# Patient Record
Sex: Female | Born: 1975 | Race: Black or African American | Hispanic: No | Marital: Married | Smoking: Never smoker
Health system: Southern US, Community
[De-identification: ages and names within clinical notes are randomized; demographics above are authoritative.]

## PROBLEM LIST (undated history)

## (undated) DIAGNOSIS — Z8619 Personal history of other infectious and parasitic diseases: Secondary | ICD-10-CM

## (undated) HISTORY — PX: TOE SURGERY: SHX1073

## (undated) HISTORY — DX: Personal history of other infectious and parasitic diseases: Z86.19

## (undated) HISTORY — PX: WISDOM TOOTH EXTRACTION: SHX21

---

## 2007-01-17 ENCOUNTER — Inpatient Hospital Stay (HOSPITAL_COMMUNITY): Admission: AD | Admit: 2007-01-17 | Discharge: 2007-01-17 | Payer: Self-pay | Admitting: Obstetrics & Gynecology

## 2007-01-23 ENCOUNTER — Inpatient Hospital Stay (HOSPITAL_COMMUNITY): Admission: AD | Admit: 2007-01-23 | Discharge: 2007-01-23 | Payer: Self-pay | Admitting: Family Medicine

## 2007-04-15 ENCOUNTER — Inpatient Hospital Stay (HOSPITAL_COMMUNITY): Admission: AD | Admit: 2007-04-15 | Discharge: 2007-04-15 | Payer: Self-pay | Admitting: Obstetrics and Gynecology

## 2007-04-29 ENCOUNTER — Inpatient Hospital Stay (HOSPITAL_COMMUNITY): Admission: AD | Admit: 2007-04-29 | Discharge: 2007-04-29 | Payer: Self-pay | Admitting: Family Medicine

## 2007-05-17 ENCOUNTER — Ambulatory Visit (HOSPITAL_COMMUNITY): Admission: RE | Admit: 2007-05-17 | Discharge: 2007-05-17 | Payer: Self-pay | Admitting: Family Medicine

## 2007-05-30 ENCOUNTER — Inpatient Hospital Stay (HOSPITAL_COMMUNITY): Admission: AD | Admit: 2007-05-30 | Discharge: 2007-05-30 | Payer: Self-pay | Admitting: Family Medicine

## 2007-06-14 ENCOUNTER — Ambulatory Visit (HOSPITAL_COMMUNITY): Admission: RE | Admit: 2007-06-14 | Discharge: 2007-06-14 | Payer: Self-pay | Admitting: Family Medicine

## 2007-10-01 ENCOUNTER — Inpatient Hospital Stay (HOSPITAL_COMMUNITY): Admission: AD | Admit: 2007-10-01 | Discharge: 2007-10-01 | Payer: Self-pay | Admitting: Obstetrics and Gynecology

## 2007-10-20 ENCOUNTER — Inpatient Hospital Stay (HOSPITAL_COMMUNITY): Admission: AD | Admit: 2007-10-20 | Discharge: 2007-10-20 | Payer: Self-pay | Admitting: Obstetrics and Gynecology

## 2007-10-22 ENCOUNTER — Inpatient Hospital Stay (HOSPITAL_COMMUNITY): Admission: AD | Admit: 2007-10-22 | Discharge: 2007-10-22 | Payer: Self-pay | Admitting: Obstetrics and Gynecology

## 2007-11-24 ENCOUNTER — Inpatient Hospital Stay (HOSPITAL_COMMUNITY): Admission: RE | Admit: 2007-11-24 | Discharge: 2007-11-27 | Payer: Self-pay | Admitting: Obstetrics and Gynecology

## 2007-11-24 ENCOUNTER — Encounter (INDEPENDENT_AMBULATORY_CARE_PROVIDER_SITE_OTHER): Payer: Self-pay | Admitting: Obstetrics and Gynecology

## 2009-01-22 ENCOUNTER — Ambulatory Visit (HOSPITAL_COMMUNITY): Admission: RE | Admit: 2009-01-22 | Discharge: 2009-01-22 | Payer: Self-pay | Admitting: Obstetrics and Gynecology

## 2009-03-12 ENCOUNTER — Ambulatory Visit (HOSPITAL_COMMUNITY): Admission: RE | Admit: 2009-03-12 | Discharge: 2009-03-12 | Payer: Self-pay | Admitting: Obstetrics and Gynecology

## 2009-04-02 ENCOUNTER — Ambulatory Visit (HOSPITAL_COMMUNITY): Admission: RE | Admit: 2009-04-02 | Discharge: 2009-04-02 | Payer: Self-pay | Admitting: Obstetrics and Gynecology

## 2009-04-23 ENCOUNTER — Ambulatory Visit (HOSPITAL_COMMUNITY): Admission: RE | Admit: 2009-04-23 | Discharge: 2009-04-23 | Payer: Self-pay | Admitting: Obstetrics and Gynecology

## 2009-05-21 ENCOUNTER — Ambulatory Visit (HOSPITAL_COMMUNITY): Admission: RE | Admit: 2009-05-21 | Discharge: 2009-05-21 | Payer: Self-pay | Admitting: Internal Medicine

## 2009-06-12 ENCOUNTER — Inpatient Hospital Stay (HOSPITAL_COMMUNITY): Admission: AD | Admit: 2009-06-12 | Discharge: 2009-06-12 | Payer: Self-pay | Admitting: Obstetrics and Gynecology

## 2009-06-14 ENCOUNTER — Inpatient Hospital Stay (HOSPITAL_COMMUNITY): Admission: AD | Admit: 2009-06-14 | Discharge: 2009-06-17 | Payer: Self-pay | Admitting: Obstetrics and Gynecology

## 2009-06-23 ENCOUNTER — Inpatient Hospital Stay (HOSPITAL_COMMUNITY): Admission: AD | Admit: 2009-06-23 | Discharge: 2009-06-24 | Payer: Self-pay | Admitting: Obstetrics and Gynecology

## 2009-09-27 ENCOUNTER — Ambulatory Visit: Payer: Self-pay | Admitting: Family Medicine

## 2009-09-27 DIAGNOSIS — IMO0001 Reserved for inherently not codable concepts without codable children: Secondary | ICD-10-CM | POA: Insufficient documentation

## 2009-09-27 LAB — CONVERTED CEMR LAB
Glucose, Urine, Semiquant: NEGATIVE
Ketones, urine, test strip: NEGATIVE
Urobilinogen, UA: 0.2

## 2009-10-04 ENCOUNTER — Ambulatory Visit: Payer: Self-pay | Admitting: Family Medicine

## 2009-10-05 ENCOUNTER — Telehealth (INDEPENDENT_AMBULATORY_CARE_PROVIDER_SITE_OTHER): Payer: Self-pay | Admitting: *Deleted

## 2009-10-05 LAB — CONVERTED CEMR LAB: Anti Nuclear Antibody(ANA): NEGATIVE

## 2009-10-09 LAB — CONVERTED CEMR LAB
AST: 20 units/L (ref 0–37)
Albumin: 4 g/dL (ref 3.5–5.2)
Alkaline Phosphatase: 59 units/L (ref 39–117)
Basophils Absolute: 0 10*3/uL (ref 0.0–0.1)
Bilirubin, Direct: 0.1 mg/dL (ref 0.0–0.3)
Calcium: 9.5 mg/dL (ref 8.4–10.5)
Chloride: 107 meq/L (ref 96–112)
Creatinine, Ser: 0.7 mg/dL (ref 0.4–1.2)
Eosinophils Absolute: 0.1 10*3/uL (ref 0.0–0.7)
Eosinophils Relative: 1.9 % (ref 0.0–5.0)
GFR calc non Af Amer: 124.02 mL/min (ref >60)
Glucose, Bld: 87 mg/dL (ref 70–99)
HCT: 37.7 % (ref 36.0–46.0)
HDL: 105.1 mg/dL (ref >39.00)
Hemoglobin: 12.8 g/dL (ref 12.0–15.0)
Monocytes Absolute: 0.4 10*3/uL (ref 0.1–1.0)
Monocytes Relative: 7.7 % (ref 3.0–12.0)
Potassium: 4.2 meq/L (ref 3.5–5.1)
RBC: 3.91 M/uL (ref 3.87–5.11)
Rhuematoid fact SerPl-aCnc: 25.8 intl units/mL — ABNORMAL HIGH (ref 0.0–20.0)
Total Bilirubin: 1.1 mg/dL (ref 0.3–1.2)
Total Protein: 7.5 g/dL (ref 6.0–8.3)
Triglycerides: 38 mg/dL (ref 0.0–149.0)
WBC: 5.3 10*3/uL (ref 4.5–10.5)

## 2010-03-29 IMAGING — US US OB FOLLOW-UP
1 series · 14 of 28 positions shown · non-contrast
Comparison: none

OBSTETRICAL ULTRASOUND:
 This ultrasound was performed in The [HOSPITAL], and the [facility] report will be stored to [REDACTED] PACS.

[Series 1: us ob follow-up · 14 of 32 slices shown]
[im 2/32]
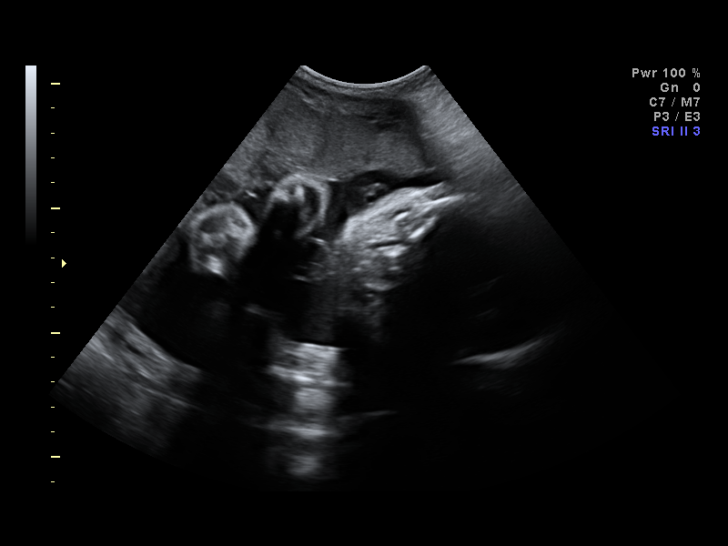
[im 4/32]
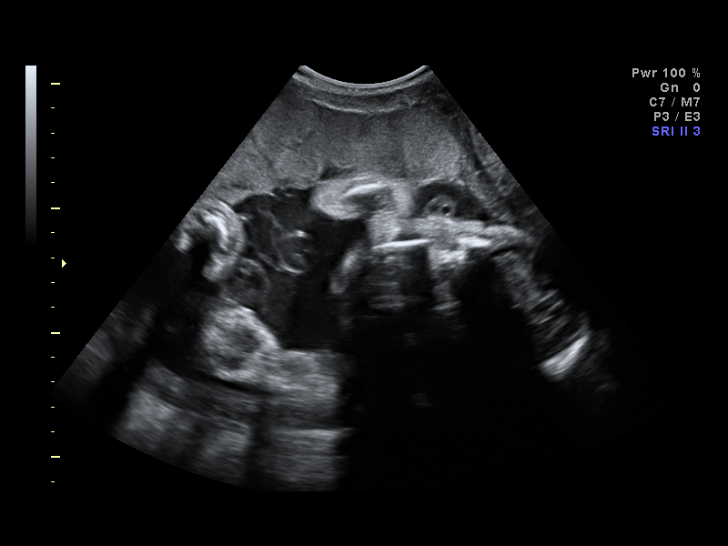
[im 6/32]
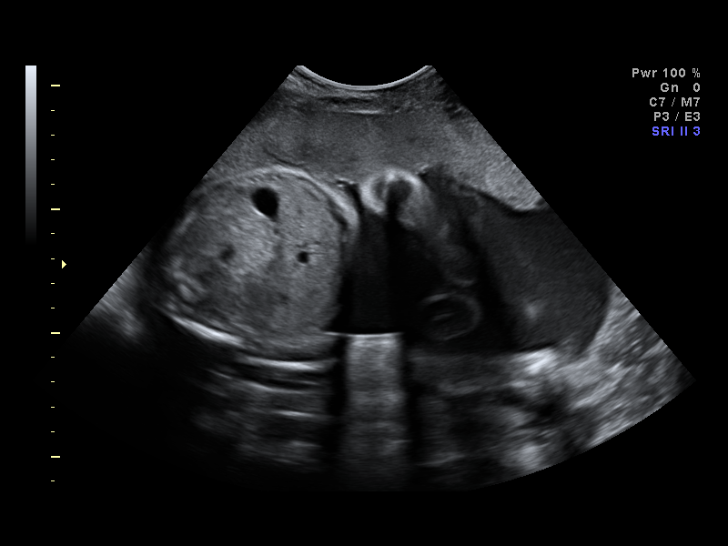
[im 9/32]
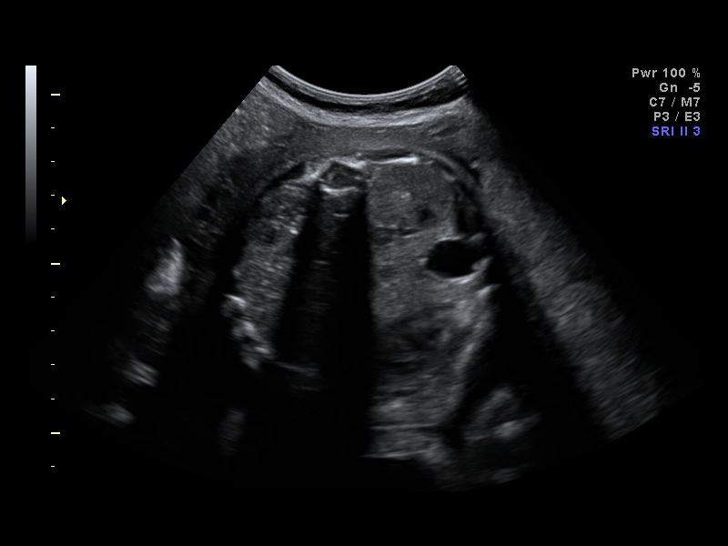
[im 11/32]
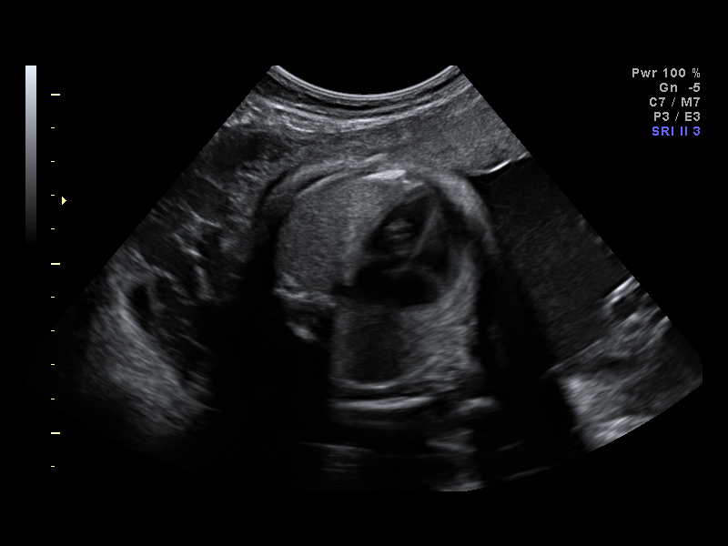
[im 13/32]
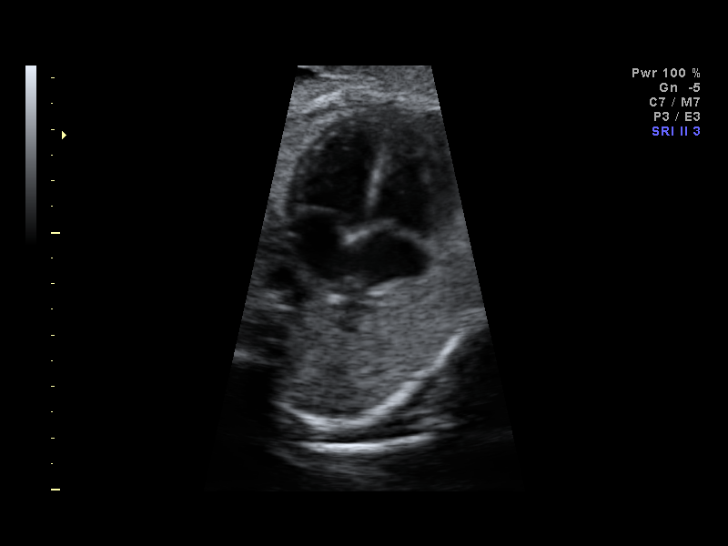
[im 15/32]
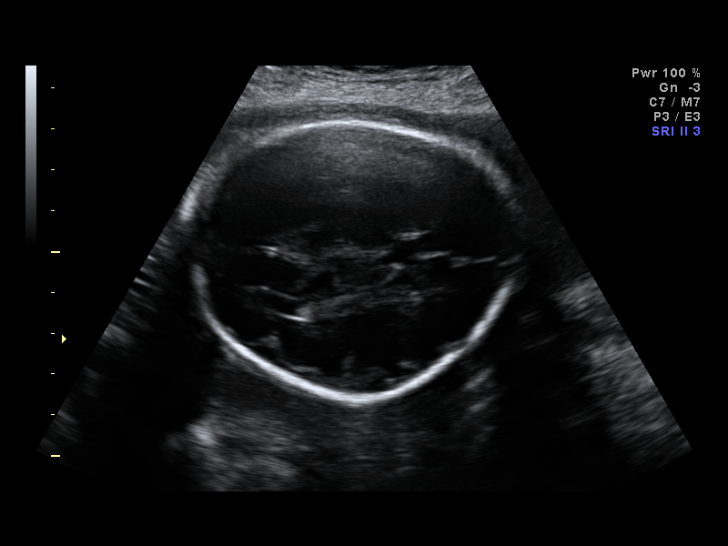
[im 18/32]
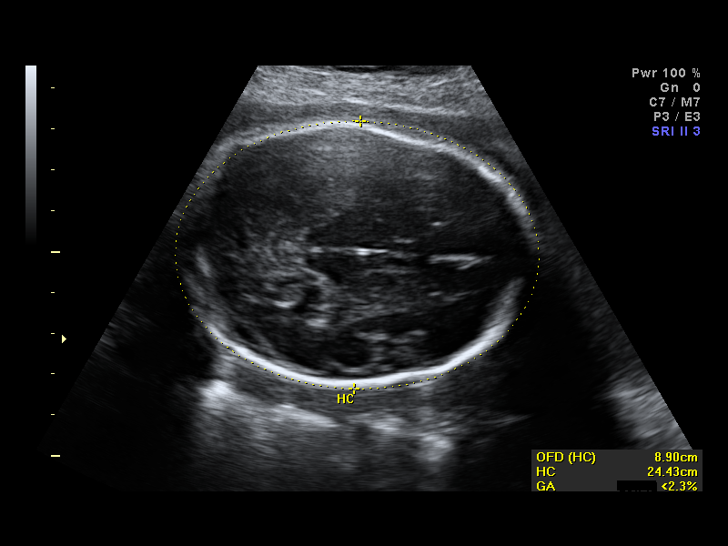
[im 20/32]
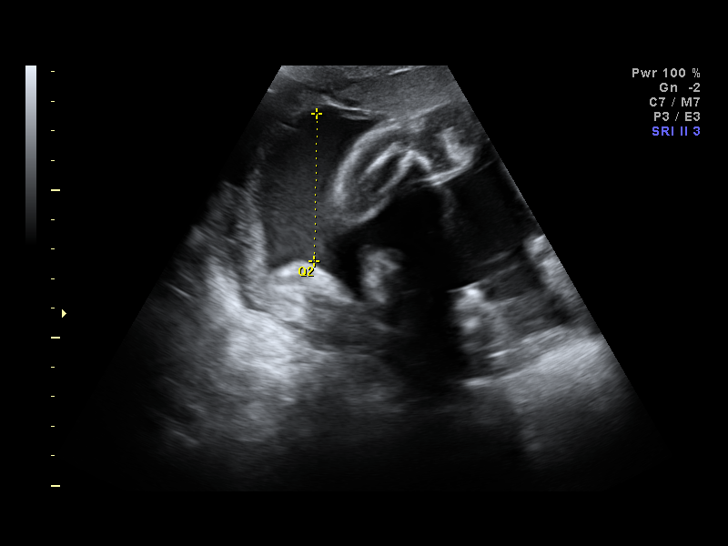
[im 22/32]
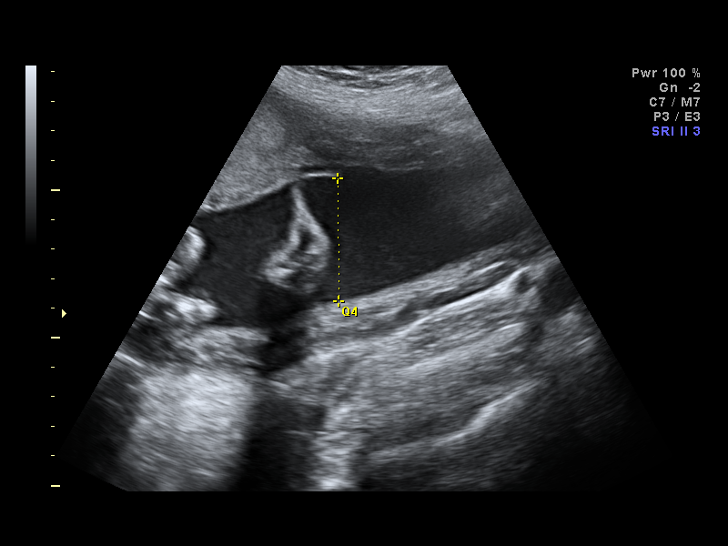
[im 25/32]
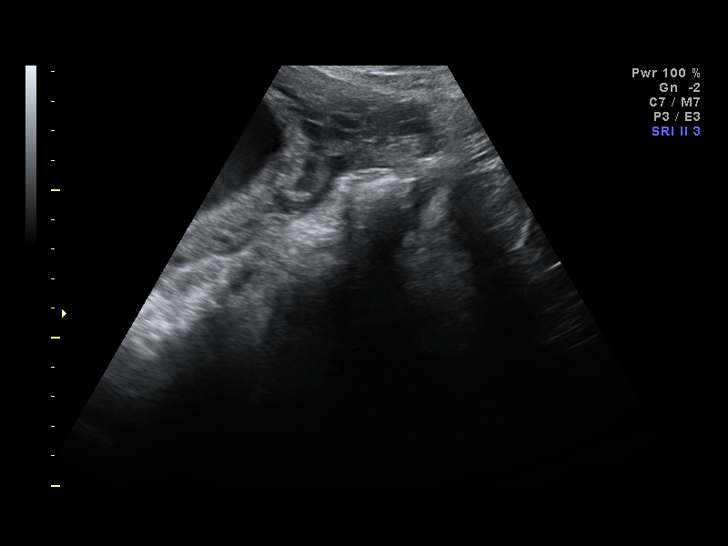
[im 27/32]
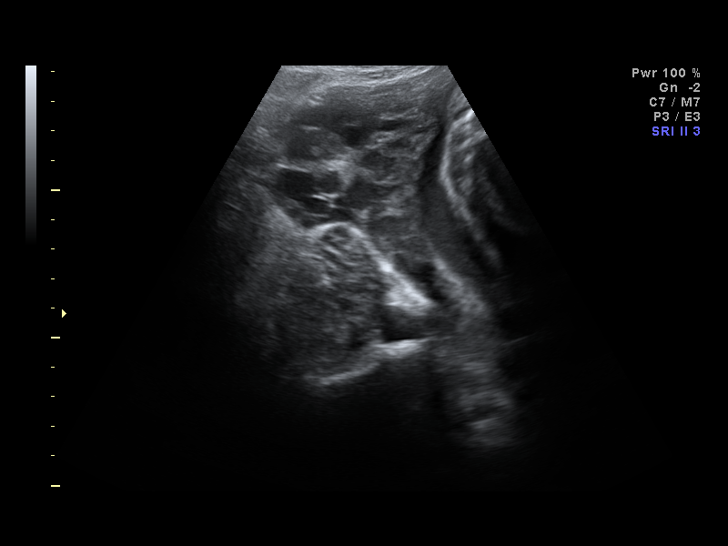
[im 29/32]
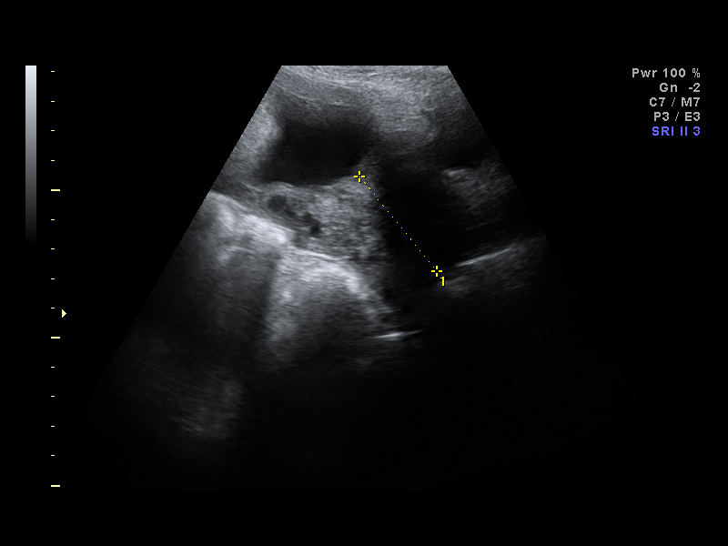
[im 32/32]
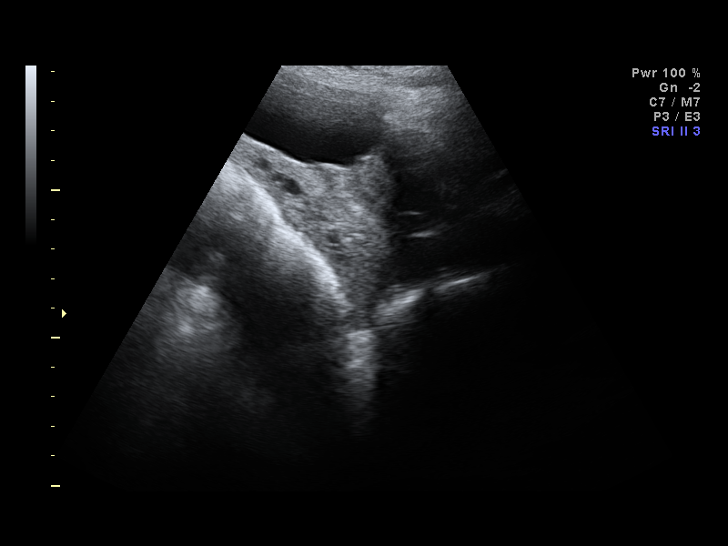

[14 of 28 positions shown; findings below may reference images not displayed]

IMPRESSION: [facility] has also been faxed to the ordering physician.

## 2011-01-27 ENCOUNTER — Encounter (HOSPITAL_COMMUNITY)
Admission: RE | Admit: 2011-01-27 | Discharge: 2011-01-27 | Disposition: A | Discharge status: Home / Self Care | Payer: 59 | Source: Ambulatory Visit | Attending: Obstetrics and Gynecology | Admitting: Obstetrics and Gynecology

## 2011-01-27 LAB — CBC
HCT: 37.6 % (ref 36.0–46.0)
MCHC: 34.6 g/dL (ref 30.0–36.0)
RBC: 3.99 MIL/uL (ref 3.87–5.11)
RDW: 15.1 % (ref 11.5–15.5)

## 2011-01-27 LAB — RPR: RPR Ser Ql: NONREACTIVE

## 2011-01-28 LAB — SURGICAL PCR SCREEN
MRSA, PCR: NEGATIVE
Staphylococcus aureus: NEGATIVE

## 2011-02-01 LAB — URINE MICROSCOPIC-ADD ON: WBC, UA: NONE SEEN WBC/hpf (ref <3)

## 2011-02-01 LAB — CBC
HCT: 38.9 % (ref 36.0–46.0)
HCT: 40 % (ref 36.0–46.0)
Hemoglobin: 13.1 g/dL (ref 12.0–15.0)
Hemoglobin: 13.1 g/dL (ref 12.0–15.0)
Hemoglobin: 14.2 g/dL (ref 12.0–15.0)
MCHC: 34.3 g/dL (ref 30.0–36.0)
MCHC: 35.6 g/dL (ref 30.0–36.0)
MCV: 100.7 fL — ABNORMAL HIGH (ref 78.0–100.0)
MCV: 101.8 fL — ABNORMAL HIGH (ref 78.0–100.0)
Platelets: 452 10*3/uL — ABNORMAL HIGH (ref 150–400)
RBC: 3.76 MIL/uL — ABNORMAL LOW (ref 3.87–5.11)
RBC: 4.05 MIL/uL (ref 3.87–5.11)
RDW: 14.4 % (ref 11.5–15.5)
WBC: 16.4 10*3/uL — ABNORMAL HIGH (ref 4.0–10.5)
WBC: 8.9 10*3/uL (ref 4.0–10.5)

## 2011-02-01 LAB — TYPE AND SCREEN: ABO/RH(D): A POS

## 2011-02-01 LAB — COMPREHENSIVE METABOLIC PANEL
Alkaline Phosphatase: 68 U/L (ref 39–117)
BUN: 12 mg/dL (ref 6–23)
Chloride: 110 mEq/L (ref 96–112)
Creatinine, Ser: 0.72 mg/dL (ref 0.4–1.2)
GFR calc non Af Amer: >60 mL/min (ref >60)
Glucose, Bld: 88 mg/dL (ref 70–99)
Potassium: 3.7 mEq/L (ref 3.5–5.1)
Total Bilirubin: 0.6 mg/dL (ref 0.3–1.2)

## 2011-02-01 LAB — URINALYSIS, ROUTINE W REFLEX MICROSCOPIC
Leukocytes, UA: NEGATIVE
Nitrite: NEGATIVE
Protein, ur: NEGATIVE mg/dL
Specific Gravity, Urine: 1.015 (ref 1.005–1.030)
Urobilinogen, UA: 1 mg/dL (ref 0.0–1.0)

## 2011-02-01 LAB — CCBB MATERNAL DONOR DRAW

## 2011-02-02 ENCOUNTER — Inpatient Hospital Stay (HOSPITAL_COMMUNITY)
Admission: RE | Admit: 2011-02-02 | Discharge: 2011-02-05 | DRG: 766 | Disposition: A | Discharge status: Home / Self Care | Payer: 59 | Source: Ambulatory Visit | Attending: Obstetrics and Gynecology | Admitting: Obstetrics and Gynecology

## 2011-02-02 DIAGNOSIS — O34219 Maternal care for unspecified type scar from previous cesarean delivery: Principal | ICD-10-CM | POA: Diagnosis present

## 2011-02-02 LAB — CBC
HCT: 37 % (ref 36.0–46.0)
MCH: 32.9 pg (ref 26.0–34.0)
MCHC: 35.1 g/dL (ref 30.0–36.0)
MCV: 93.7 fL (ref 78.0–100.0)
Platelets: 321 10*3/uL (ref 150–400)
RDW: 14.9 % (ref 11.5–15.5)
WBC: 7.4 10*3/uL (ref 4.0–10.5)

## 2011-02-02 LAB — RPR: RPR Ser Ql: NONREACTIVE

## 2011-02-02 NOTE — Op Note (Signed)
  Tammy, [name]             ACCOUNT NO.:  [id number]  MEDICAL RECORD NO.:  [id number]           PATIENT TYPE:  I  LOCATION:  9125                          FACILITY:  [facility]  PHYSICIAN:  Berta Denson L. Neng Albee, M.D.DATE OF BIRTH:  Apr 17, 1976  DATE OF PROCEDURE:  02/02/2011 DATE OF DISCHARGE:                              OPERATIVE REPORT   PREOPERATIVE DIAGNOSIS:  Intrauterine pregnancy at 39-4, spontaneous rupture of membranes, and previous cesarean section x2.  POSTOPERATIVE DIAGNOSIS:  Intrauterine pregnancy at 39-4, spontaneous rupture of membranes, and previous cesarean section x2.  PROCEDURE:  Repeat low transverse cesarean section.  SURGEON:  Lanique Gonzalo L. Symone Cornman, MD  ANESTHESIA:  Spinal.  FINDINGS:  Female infant in cephalic presentation, Apgars 8 at 1 minute and 9 at 5 minutes.  PATHOLOGY:  None.  ESTIMATED BLOOD LOSS:  500 mL.  COMPLICATIONS:  None.  DRAINS:  Foley.  PROCEDURE:  The patient was taken to the operating room.  Her spinal was placed.  She was then prepped and draped in usual sterile fashion. Foley catheter was inserted, a low transverse incision was made, and was carried down to the rectus fascia.  The fascia was scored in the midline, extended laterally.  Rectus muscles were separated in midline. The peritoneum was entered bluntly.  The peritoneal incision was then stretched.  The bladder blade was inserted.  The lower uterine segment was identified.  The bladder flap was created sharply and then digitally.  The bladder blade was then readjusted.  A low transverse incision was made in the uterus and the amniotic fluid was clear.  The baby was in cephalic presentation and was delivered easily with a vacuum extractor with a female infant, Apgars 8 at 1 minute and 9 at 5 minutes. The cord was clamped and cut.  The baby was handed to the awaiting pediatricians and taken to the newborn nursery.  The baby was very vigorous.  The uterus was exteriorized.   The placenta was manually removed and noted to be normal, intact with a three-vessel cord.  The uterus was cleared of all clots and debris.  Antibiotics and Pitocin were given, and the uterus was firm.  Adnexa were normal.  The uterine incision was closed in one layer using 0 chromic using a running locked stitch.  The uterus was returned to the abdomen, irrigation was performed, hemostasis was excellent.  The peritoneum and rectus muscles were reapproximated using 0 Vicryl in a running stitch.  The fascia was closed using a running stitch using 0 Vicryl. After irrigation of the subcutaneous layer, the skin was closed with a 4- 0 Vicryl subcuticular, and then Dermabond was applied.  A 0.25% Marcaine was injected into the incision.  All sponge, lap, and instrument counts were correct x2.  The patient went to recovery room in stable condition.     Christ Fullenwider L. Vincente Poli, M.D.     Florestine Avers  D:  02/02/2011  T:  02/02/2011  Job:  [id number]  Electronically Signed by Marcelle Overlie M.D. on 02/02/2011 05:41:04 AM

## 2011-02-03 LAB — TORCH-IGM(TOXO/ RUB/ CMV/ HSV) W TITER
CMV IgM: 0.45 IV
Rubella IgM Index: 0.52 IV
Toxoplasma IgM: 0.33 IV

## 2011-02-03 LAB — CBC
HCT: 36.1 % (ref 36.0–46.0)
Hemoglobin: 12.1 g/dL (ref 12.0–15.0)
RDW: 15.4 % (ref 11.5–15.5)
WBC: 19.2 10*3/uL — ABNORMAL HIGH (ref 4.0–10.5)

## 2011-02-03 LAB — TORCH TITERS-IGG(TOXO/ RUB/ CMV/ HSV)
Rubella IgG Scr: 38 IU/mL
Toxoplasma IgG Antibody (EIA): 24 IU/mL

## 2011-02-13 NOTE — Discharge Summary (Signed)
  Tammy [name], Tammy             ACCOUNT NO.:  [id number]  MEDICAL RECORD NO.:  [id number]           PATIENT TYPE:  I  LOCATION:  9125                          [facility]ACILITY:  [facility]  PHYSICIAN:  Doris Gruhn L. Olimpia Tinch, M.D.DATE OF BIRTH:  08-12-1976  DATE OF ADMISSION:  02/02/2011 DATE OF DISCHARGE:  02/05/2011                              DISCHARGE SUMMARY   ADMITTING DIAGNOSES: 1. Intrauterine pregnancy at 39-4/7 weeks estimated gestational age. 2. Previous cesarean section x2. 3. Spontaneous rupture of membranes.  DISCHARGE DIAGNOSES: 1. Status post low-transverse cesarean section. 2. Viable female infant.  PROCEDURE:  Repeat low-transverse cesarean section.  REASON FOR ADMISSION:  Please see written H and P.  HOSPITAL COURSE:  The patient is a 35 year old gravida 4, para 2 who presented to the [facility] at 39-4/7 weeks estimated gestational age with spontaneous rupture of membranes in early onset of labor.  The patient had had two previous cesarean deliveries and desired repeat.  Confirmation of rupture of membranes was performed and decision was made to proceed with a repeat low-transverse cesarean section. Fetal heart tones were reassuring.  The patient was then taken to the operating room where spinal anesthesia was administered without difficulty.  A low-transverse incision was made with delivery of a viable female infant with Apgars of 9 at 1 minute and 9 at 5 minutes. The patient tolerated the procedure well and was taken to the recovery room in stable condition.  On postoperative day #1, the patient was without complaint.  Vital signs were stable.  Fundus firm and nontender. Incision was clean, dry, and intact with a Dermabond closure.  Foley had been discontinued.  She is voiding well.  Laboratory findings showed hemoglobin 12.1, platelet count 282,000.  On postoperative day #2, the patient was without complaint.  Vital signs remained stable.   Abdomen soft with good return of bowel function.  Fundus firm and nontender. Incision was clean, dry, and intact.  Postoperative day #3, the patient continued to be without complaint.  Vital signs were stable.  Abdomen soft.  Fundus firm and nontender.  Discharge instructions were reviewed, and the patient later discharged home.  CONDITION ON DISCHARGE:  Stable.  DIET:  Regular as tolerated.  ACTIVITY:  No heavy lifting, no driving x2 weeks, no vaginal entry.  FOLLOWUP:  The patient is to follow up in the office in 1-2 weeks for an incision check.  She is to call for temperature greater than 100 degrees, persistent nausea, vomiting, heavy vaginal bleeding, and/or redness or drainage from the incisional site.  DISCHARGE MEDICATIONS: 1. Tylox #30 one p.o. q.4-6 p.r.n. 2. Motrin 600 mg every 6 hours. 3. Prenatal vitamins 1 p.o. daily.     Julio Sicks, N.P.   ______________________________ Stann Mainland. Vincente Poli, M.D.    CC/MEDQ  D:  02/05/2011  T:  02/05/2011  Job:  [id number]  Electronically Signed by Julio Sicks N.P. on 02/10/2011 09:37:41 AM Electronically Signed by Marcelle Overlie M.D. on 02/13/2011 06:18:21 PM

## 2011-02-15 ENCOUNTER — Inpatient Hospital Stay (INDEPENDENT_AMBULATORY_CARE_PROVIDER_SITE_OTHER)
Admission: RE | Admit: 2011-02-15 | Discharge: 2011-02-15 | Disposition: A | Discharge status: Home / Self Care | Payer: 59 | Source: Ambulatory Visit | Attending: Family Medicine | Admitting: Family Medicine

## 2011-02-15 DIAGNOSIS — I951 Orthostatic hypotension: Secondary | ICD-10-CM

## 2011-02-15 DIAGNOSIS — N390 Urinary tract infection, site not specified: Secondary | ICD-10-CM

## 2011-02-15 LAB — POCT URINALYSIS DIP (DEVICE)
Bilirubin Urine: NEGATIVE
Nitrite: NEGATIVE
Specific Gravity, Urine: 1.02 (ref 1.005–1.030)
pH: 5.5 (ref 5.0–8.0)

## 2011-03-11 NOTE — H&P (Signed)
[name], Tammy             ACCOUNT NO.:  [id number]   MEDICAL RECORD NO.:  [id number]          PATIENT TYPE:  INP   LOCATION:                                FACILITY:  [facility]   PHYSICIAN:  Duke Salvia. 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   HISTORY OF PRESENT ILLNESS:  A 35 year old G2, P0-0-1-0.  EDD is November 24, 2007.  Blood type is A positive.  Rubella titer is positive.  Initial pregnancy care was uneventful with normal growth.  Starting at  32 weeks, she complained of decreased fetal movement.  NST was performed  that was reactive.  On October 20, 2007 size greater than dates was  noted, and she had follow up ultrasound at that point.  Her 1-hour GTT  was elevated, but she was unable to do the 3-hour GTT, so the decision  was made to proceed with just following CBG levels.  Ultrasound on  November 08, 2007 showed AFI that was greater than the 97th percentile  with a random CBG at 132.  When she presented November 22, 2006, the  vertex was floating.  BPP was 8/8, but the NST showed a very flat, non-  reactive tracing, despite a normal BPP.  EFW was 7 pounds, 12 ounces at  the 56th percentile, but the AFI was high at 35.6, greater than the 97th  percentile, with a floating vertex, unfavorable cervix.   There was concern by ultrasound scanning of a possible variable for  esophageal atresia.  This was discussed with the patient, along with the  findings of the unfavorable cervix.  The decision was made to proceed  with primary cesarean section.  This procedure, including risks related  to bleeding, infection, transfusion, adjacent organ injury, along with  her expected recovery time were all discussed.  The pediatric team was  likewise notified.   PAST MEDICAL HISTORY:  GBS  screening was positive.   ALLERGIES:  None.   OBSTETRICAL HISTORY:  One SAB is March of 2008.   FAMILY HISTORY:  Significant for father with colon cancer.  Otherwise  negative.  Please see the [facility] form for details.   PHYSICAL EXAMINATION:  VITAL SIGNS:  Temperature 98.2, blood pressure  120/78.  HEENT:  Unremarkable.  NECK:  Supple without masses.  LUNGS:  Clear.  CARDIOVASCULAR:  Regular rate and rhythm without murmurs, rubs, or  gallops.  BREASTS:  Not examined.  ABDOMEN:  Forty-five centimeter fundal height.  Fetal heart rate 140.  PELVIC:  Cervix long, closed.  Floating vertex.  EXTREMITIES:  Unremarkable.  NEUROLOGIC:  Unremarkable.   IMPRESSION:  1. Term intrauterine pregnancy.  2. Polyhydramnios.  3. Possible gestational diabetes.  4. 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   PLAN:  Primary cesarean section.  Procedure and risks reviewed as above.      Richard M. Marcelle Overlie, M.D.  Electronically Signed     RMH/MEDQ  D:  11/24/2007  T:  11/24/2007  Job:  [id number]

## 2011-03-11 NOTE — Discharge Summary (Signed)
Tammy [name], Tammy [name]             ACCOUNT NO.:  [id number]   MEDICAL RECORD NO.:  [id number]          PATIENT TYPE:  INP   LOCATION:  9318                          [facility]ACILITY:  [facility]   PHYSICIAN:  Guy Sandifer. Henderson Cloud, M.D. DATE OF BIRTH:  1975-12-26   DATE OF ADMISSION:  11/24/2007  DATE OF DISCHARGE:  11/27/2007                               DISCHARGE SUMMARY   ADMITTING DIAGNOSES:  1. Term intrauterine pregnancy.  2. Polyhydramnios.  3. 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   DISCHARGE DIAGNOSES:  1. Term intrauterine pregnancy.  2. Polyhydramnios.  3. 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   PROCEDURE:  On November 24, 2007, is primary low transverse cesarean  section.   REASON FOR ADMISSION:  This patient is a 35 year old married black  female G2, P1, who was found to have polyhydramnios and ultrasound  findings possibly consistent with esophageal atresia or a variant.  After careful discussion of the options she is being admitted for  delivery.   HOSPITAL COURSE:  The patient was admitted to the hospital and taken to  the operating room where she undergoes the above procedure.  It was  productive of a viable female infant, Apgars of 7 and 9 respectively.  The  baby is subsequently transferred to [facility].  On the first  postoperative day vital signs are stable.  She is afebrile.  On the  second postoperative day she has resumption of normal bowel function.  Possible discharge is discussed and information and instructions and  medications are given.  However, the patient wants to stay one more  night for additional rest and pain relief.  On the day of discharge she  is feeling better, ambulating, voiding, passing flatus.  Hemoglobin was  10.1 on the first postoperative day.  Pathology is pending.   CONDITION ON DISCHARGE:  Good.   DIET:  Regular as tolerated.   ACTIVITY:  No lifting, no operation of automobiles, no vaginal entry.  She is to call the office for problems  including but not limited to  temperature 101 degrees, heavy bleeding or increasing pain.   MEDICATIONS:  1. Percocet 5/325 mg #40 one to two p.o. q.6 h. p.r.n.  2. Ibuprofen 600 mg q.6 h. p.r.n.  3. Prenatal vitamin daily.   FOLLOWUP:  Is in the office in 2 weeks.      Guy Sandifer Henderson Cloud, M.D.  Electronically Signed     JET/MEDQ  D:  11/27/2007  T:  11/27/2007  Job:  [id number]

## 2011-03-11 NOTE — Op Note (Signed)
NAMEGAYLIN, Tammy [name]             ACCOUNT NO.:  [id number]   MEDICAL RECORD NO.:  [id number]          PATIENT TYPE:  INP   LOCATION:  9199                          [facility]ILITY:  [facility]   PHYSICIAN:  Duke Salvia. Marcelle Overlie, M.D.DATE OF BIRTH:  May 01, 1976   DATE OF PROCEDURE:  11/24/2007  DATE OF DISCHARGE:                               OPERATIVE REPORT   PREOPERATIVE DIAGNOSIS:  1. Term intrauterine pregnancy.  2. Polyhydramnios.  3. 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  4. Unfavorable cervix.   POSTOPERATIVE DIAGNOSIS:  1. Term intrauterine pregnancy.  2. Polyhydramnios.  3. 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  4. Unfavorable cervix.   PROCEDURE:  Primary low transverse cesarean section.   SURGEON:  Duke Salvia. Marcelle Overlie, M.D.   ANESTHESIA:  Spinal.   COMPLICATIONS:  None.   DRAINS:  Foley catheter.   BLOOD LOSS:  700 mL.   SPECIMENS:  Placenta and membranes sent to pathology.  Cord pH is  pending.   PROCEDURE AND FINDINGS:  The patient was taken to the operating room.  After an adequate level of spinal anesthetic was obtained with the  patient in the left tilt position, the abdomen was prepped and draped in  the usual manner for sterile abdominal procedures.  A Foley catheter was  positioned draining clear urine.  A Pfannenstiel incision was made two  fingerbreadths above the symphysis, carried down to the fascia, which  was incised and extended transversely.  The rectus muscles were divided  in the midline.  The peritoneum was entered superiorly without incident  and extended in a vertical fashion.  The vesicouterine serosa was  incised and the bladder was bluntly and sharply dissected below, the  bladder blade repositioned.  A transverse incision was made in the lower  segment and extended with bandage scissors.  Very thin meconium fluid  that was copious was allowed to drain. This brought the vertex into the  incision.  The incision was extended with bandage  scissors.  The infant  was suctioned, cord clamped, and the infant passed to pediatric team for  further care.  A female, Apgars 7/9, cord pH pending.  The placenta was  then removed spontaneously.  The uterus exteriorized, the cavity wiped  clean with laparotomy pack.  Closure obtained with a first layer of 0  chromic in a locked fashion followed by an imbricating layer of 0  chromic.  This was hemostatic.  Bilateral tubes and ovaries were normal.  The bladder flap area was intact and hemostatic.  Prior to closure, the  sponge, needle, and instrument counts were reported as correct x2.  The  peritoneum was closed with running 3-0 Dexon suture.  3-0 Vicryl  interrupted sutures were used to reapproximate the rectus muscles.  This  area was hemostatic.  The fascia closed from laterally to midline on  either side with 0 PDS suture.  The subcutaneous tissue was hemostatic.  Clips and Steri-Strips were used on the skin.  Clear urine was noted at  the end of the case.  The infant went to the NICU for further  observation to rule out an airway  or esophageal anomaly.  The patient  received Pitocin IV after the cord was clamped and preoperative Ancef 1  gram IV.      Richard M. Marcelle Overlie, M.D.  Electronically Signed     RMH/MEDQ  D:  11/24/2007  T:  11/24/2007  Job:  [id number]

## 2011-03-11 NOTE — Op Note (Signed)
NAMEFERRELL, Tammy [name]             ACCOUNT NO.:  [id number]   MEDICAL RECORD NO.:  [id number]          PATIENT TYPE:  INP   LOCATION:  9199                          [facility]   PHYSICIAN:  Zelphia Cairo, MD    DATE OF BIRTH:  1976/10/24   DATE OF PROCEDURE:  06/14/2009  DATE OF DISCHARGE:                               OPERATIVE REPORT   PREOPERATIVE DIAGNOSES:  1. Intrauterine pregnancy at 39 weeks.  2. History of cesarean section, desires repeat.   POSTOPERATIVE DIAGNOSES:  1. Intrauterine pregnancy at 39 weeks.  2. History of cesarean section, desires repeat.   PROCEDURE:  Repeat low-transverse cesarean delivery.   SURGEON:  Zelphia Cairo, MD   ANESTHESIA:  Spinal.   ESTIMATED BLOOD LOSS:  300 mL.   URINE OUTPUT:  400 mL, clear urine.   FINDINGS:  Viable female infant with Apgars of 9 and 9, normal-appearing  pelvic anatomy.   COMPLICATIONS:  None.   CONDITION:  Stable to recovery room.   DESCRIPTION OF PROCEDURE:  The patient was taken to the operating room  where spinal anesthesia was found to be adequate.  She was placed in the  supine position with a left tilt.  She was prepped and draped in sterile  fashion and a Foley catheter was inserted.  Keloid scar was removed  using the scalpel and the incision was then carried down to the  underlying fascia using the scalpel.  The fascia was incised in the  midline and extended laterally using Mayo scissors.  Kocher clamps were  used to grasp the superior and inferior portion of the fascia and tented  upwards.  The underlying rectus muscles were dissected off using curved  Mayo scissors.  The rectus muscles were then divided, the peritoneum was  identified, and tented upwards, entered sharply with Metzenbaum, this  was extended superiorly and inferiorly with good visualization of the  bladder.  The bladder blade was then inserted.  The vesicouterine  peritoneum was dissected off the lower uterine segment using  blunt and  sharp dissection.  The bladder blade was then replaced.   Uterine incision was made with a scalpel and extended bluntly using my  fingers.  The fetal vertex was brought to the uterine incision and  delivered using fundal pressure.  The mouth and nose were suctioned and  the cord was clamped and cut and the infant was taken to the awaiting  pediatric staff.  The placenta was manually removed from the uterus.  The uterus was cleared of all clots and debris using a dry lap sponge.  The uterine incision was closed using double layer closure of 0 chromic  in a running locked fashion.  Once hemostasis was assured, the pelvis  was  irrigated with warm normal saline.  Uterine incision was reinspected and  found to be hemostatic.  The peritoneum was reapproximated with 0  Monocryl.  The fascia was closed with a looped 0 PDS and the skin was  closed with staples.  Sponge lap, needle, and instrument counts were  correct x2.      Zelphia Cairo, MD  Electronically Signed     GA/MEDQ  D:  06/14/2009  T:  06/14/2009  Job:  [id number]

## 2011-07-17 LAB — CBC
HCT: 28.5 — ABNORMAL LOW
Hemoglobin: 10.1 — ABNORMAL LOW
Hemoglobin: 13.4
MCHC: 35.1
MCHC: 35.6
MCV: 100.7 — ABNORMAL HIGH
MCV: 99.2
RBC: 2.83 — ABNORMAL LOW
RBC: 3.85 — ABNORMAL LOW
RDW: 14.1
WBC: 18.8 — ABNORMAL HIGH

## 2011-07-17 LAB — TYPE AND SCREEN: ABO/RH(D): A POS

## 2011-08-11 LAB — GC/CHLAMYDIA PROBE AMP, GENITAL: GC Probe Amp, Genital: NEGATIVE

## 2011-08-11 LAB — URINALYSIS, ROUTINE W REFLEX MICROSCOPIC
Bilirubin Urine: NEGATIVE
Glucose, UA: NEGATIVE
Hgb urine dipstick: NEGATIVE
Ketones, ur: NEGATIVE
Protein, ur: NEGATIVE
pH: 7.5

## 2011-08-11 LAB — WET PREP, GENITAL: Clue Cells Wet Prep HPF POC: NONE SEEN

## 2011-08-12 LAB — URINALYSIS, ROUTINE W REFLEX MICROSCOPIC
Glucose, UA: NEGATIVE
Ketones, ur: NEGATIVE
Protein, ur: NEGATIVE
Urobilinogen, UA: 0.2

## 2011-08-13 LAB — URINALYSIS, ROUTINE W REFLEX MICROSCOPIC
Bilirubin Urine: NEGATIVE
Glucose, UA: NEGATIVE
Hgb urine dipstick: NEGATIVE
Protein, ur: NEGATIVE
Specific Gravity, Urine: 1.01
Urobilinogen, UA: 0.2

## 2011-08-13 LAB — WET PREP, GENITAL
Clue Cells Wet Prep HPF POC: NONE SEEN
Trich, Wet Prep: NONE SEEN
Yeast Wet Prep HPF POC: NONE SEEN

## 2011-08-13 LAB — CBC
HCT: 39.6
Hemoglobin: 13.6
MCV: 93.3
Platelets: 355
RDW: 14.3 — ABNORMAL HIGH

## 2011-08-13 LAB — HCG, QUANTITATIVE, PREGNANCY: hCG, Beta Chain, Quant, S: 125782 — ABNORMAL HIGH

## 2011-08-13 LAB — POCT PREGNANCY, URINE: Preg Test, Ur: POSITIVE

## 2014-06-20 ENCOUNTER — Other Ambulatory Visit: Payer: Self-pay | Admitting: Obstetrics and Gynecology

## 2014-06-21 LAB — CYTOLOGY - PAP

## 2015-01-14 ENCOUNTER — Encounter (HOSPITAL_COMMUNITY): Payer: Self-pay | Admitting: *Deleted

## 2015-01-14 ENCOUNTER — Emergency Department (HOSPITAL_COMMUNITY)
Admission: EM | Admit: 2015-01-14 | Discharge: 2015-01-15 | Disposition: A | Discharge status: Home / Self Care | Payer: 59 | Attending: Emergency Medicine | Admitting: Emergency Medicine

## 2015-01-14 ENCOUNTER — Emergency Department (HOSPITAL_COMMUNITY): Payer: 59

## 2015-01-14 DIAGNOSIS — J209 Acute bronchitis, unspecified: Secondary | ICD-10-CM

## 2015-01-14 DIAGNOSIS — J029 Acute pharyngitis, unspecified: Secondary | ICD-10-CM | POA: Diagnosis present

## 2015-01-14 DIAGNOSIS — R000 Tachycardia, unspecified: Secondary | ICD-10-CM | POA: Diagnosis not present

## 2015-01-14 DIAGNOSIS — R059 Cough, unspecified: Secondary | ICD-10-CM

## 2015-01-14 DIAGNOSIS — R7989 Other specified abnormal findings of blood chemistry: Secondary | ICD-10-CM | POA: Diagnosis not present

## 2015-01-14 DIAGNOSIS — R509 Fever, unspecified: Secondary | ICD-10-CM

## 2015-01-14 DIAGNOSIS — R05 Cough: Secondary | ICD-10-CM

## 2015-01-14 LAB — BASIC METABOLIC PANEL
Anion gap: 6 (ref 5–15)
BUN: 12 mg/dL (ref 6–23)
CALCIUM: 8.9 mg/dL (ref 8.4–10.5)
CHLORIDE: 107 mmol/L (ref 96–112)
CO2: 26 mmol/L (ref 19–32)
CREATININE: 0.82 mg/dL (ref 0.50–1.10)
GFR, EST NON AFRICAN AMERICAN: 90 mL/min — AB (ref >90)
GLUCOSE: 105 mg/dL — AB (ref 70–99)
POTASSIUM: 3.1 mmol/L — AB (ref 3.5–5.1)
Sodium: 139 mmol/L (ref 135–145)

## 2015-01-14 LAB — I-STAT CG4 LACTIC ACID, ED
Lactic Acid, Venous: 1.4 mmol/L (ref 0.5–2.0)
Lactic Acid, Venous: 3.55 mmol/L (ref 0.5–2.0)

## 2015-01-14 LAB — CBC
HEMATOCRIT: 37.7 % (ref 36.0–46.0)
Hemoglobin: 13.1 g/dL (ref 12.0–15.0)
MCH: 31.8 pg (ref 26.0–34.0)
MCHC: 34.7 g/dL (ref 30.0–36.0)
MCV: 91.5 fL (ref 78.0–100.0)
PLATELETS: 294 10*3/uL (ref 150–400)
RBC: 4.12 MIL/uL (ref 3.87–5.11)
RDW: 14.2 % (ref 11.5–15.5)
WBC: 7.5 10*3/uL (ref 4.0–10.5)

## 2015-01-14 LAB — RAPID STREP SCREEN (MED CTR MEBANE ONLY): STREPTOCOCCUS, GROUP A SCREEN (DIRECT): NEGATIVE

## 2015-01-14 MED ORDER — SODIUM CHLORIDE 0.9 % IV BOLUS (SEPSIS)
1000.0000 mL | Freq: Once | INTRAVENOUS | Status: AC
Start: 2015-01-14 — End: 2015-01-14
  Administered 2015-01-14: 1000 mL via INTRAVENOUS

## 2015-01-14 MED ORDER — DEXTROSE 5 % IV SOLN
1.0000 g | Freq: Once | INTRAVENOUS | Status: AC
  Administered 2015-01-14: 1 g via INTRAVENOUS
  Filled 2015-01-14: qty 10

## 2015-01-14 MED ORDER — SODIUM CHLORIDE 0.9 % IV BOLUS (SEPSIS)
1000.0000 mL | Freq: Once | INTRAVENOUS | Status: AC
  Administered 2015-01-14: 1000 mL via INTRAVENOUS

## 2015-01-14 MED ORDER — IPRATROPIUM BROMIDE 0.02 % IN SOLN
0.5000 mg | Freq: Once | RESPIRATORY_TRACT | Status: AC
  Administered 2015-01-14: 0.5 mg via RESPIRATORY_TRACT
  Filled 2015-01-14 (×2): qty 2.5

## 2015-01-14 MED ORDER — ALBUTEROL SULFATE (2.5 MG/3ML) 0.083% IN NEBU
5.0000 mg | INHALATION_SOLUTION | Freq: Once | RESPIRATORY_TRACT | Status: AC
  Administered 2015-01-14: 5 mg via RESPIRATORY_TRACT
  Filled 2015-01-14 (×2): qty 6

## 2015-01-14 MED ORDER — ALBUTEROL SULFATE (2.5 MG/3ML) 0.083% IN NEBU
5.0000 mg | INHALATION_SOLUTION | Freq: Once | RESPIRATORY_TRACT | Status: AC
  Administered 2015-01-14: 5 mg via RESPIRATORY_TRACT
  Filled 2015-01-14: qty 6

## 2015-01-14 MED ORDER — ACETAMINOPHEN 500 MG PO TABS
1000.0000 mg | ORAL_TABLET | Freq: Once | ORAL | Status: AC
  Administered 2015-01-14: 1000 mg via ORAL
  Filled 2015-01-14: qty 2

## 2015-01-14 MED ORDER — LIDOCAINE HCL (PF) 1 % IJ SOLN: 5.0000 mL | Freq: Once | INTRAMUSCULAR | Status: DC

## 2015-01-14 NOTE — ED Provider Notes (Signed)
The patient presents with cough, sore throat, shortness of breath. On exam she has clear heart and lung sounds, mild tachycardia, fevers present. She has definite hoarseness, x-rays negative, strep negative, lactate is elevated. IV fluids given, the patient will be given IV antibiotics as she does appear ill from her febrile upper respiratory infection, she can follow up outpatient. The patient is in agreement with the plan. She has no lymphadenopathy or stiffness of the neck.  Medical screening examination/treatment/procedure(s) were conducted as a shared visit with non-physician practitioner(s) and myself.  I personally evaluated the patient during the encounter.  Clinical Impression:   Final diagnoses:  Acute bronchitis, unspecified organism  Other specified fever  Cough         Eber HongBrian Shakendra Griffeth, MD 01/15/15 [phone]

## 2015-01-14 NOTE — ED Notes (Signed)
Dr Hyacinth MeekerMiller given a copy of lactic acid results 3.55

## 2015-01-14 NOTE — ED Provider Notes (Signed)
CSN: [id number]     Arrival date & time 01/14/15  1929 History  This chart was scribed for non-physician practitioner, Celene Skeen, PA-C,working with Eber Hong, MD, by Karle Plumber, ED Scribe. This patient was seen in room TR05C/TR05C and the patient's care was started at 7:56 PM.  Chief Complaint  Patient presents with  . Sore Throat  . Cough   Patient is a 39 y.o. female presenting with pharyngitis. The history is provided by the patient and medical records. No language interpreter was used.  Sore Throat    HPI Comments:  ODESSIE POLZIN is a 39 y.o. female who presents to the Emergency Department complaining of severe sore throat that began two days ago. Pt states the pain started as a scratchy feeling but now reports it is a burning pain. She reports fever Tmax 100.8, associated dry cough with intermittent production of yellowish-green sputum, wheezing and hoarseness. Pt denies any known sick contacts. She reports taking Delsym and Ibuprofen with mild relief of her symptoms. Denies modifying factors. Denies chills, nausea, vomiting or rash.   History reviewed. No pertinent past medical history. History reviewed. No pertinent past surgical history. History reviewed. No pertinent family history. History  Substance Use Topics  . Smoking status: Never Smoker   . Smokeless tobacco: Not on file  . Alcohol Use: Not on file   OB History    No data available     Review of Systems  Constitutional: Positive for fever. Negative for chills.  HENT: Positive for sore throat and voice change.   Respiratory: Positive for cough and wheezing.   Gastrointestinal: Negative for nausea and vomiting.  All other systems reviewed and are negative.   Allergies  Review of patient's allergies indicates no known allergies.  Home Medications   Prior to Admission medications   Medication Sig Start Date End Date Taking? Authorizing Provider  amoxicillin-clavulanate (AUGMENTIN) 875-125 MG per  tablet Take 1 tablet by mouth 2 (two) times daily. One po bid x 7 days 01/15/15   Kathrynn Speed, PA-C  predniSONE (DELTASONE) 20 MG tablet 2 tabs po daily x 4 days 01/15/15   Kathrynn Speed, PA-C  promethazine-codeine (PHENERGAN WITH CODEINE) 6.25-10 MG/5ML syrup Take 5 mLs by mouth every 8 (eight) hours as needed for cough. 01/15/15   Kathrynn Speed, PA-C   Triage Vitals: BP 109/66 mmHg  Pulse 97  Temp(Src) 98.1 F (36.7 C) (Oral)  Resp 20  Ht  (1.626 m)  Wt 140 lb (63.504 kg)  BMI 24.02 kg/m2  SpO2 100% Physical Exam  Constitutional: She is oriented to person, place, and time. She appears well-developed and well-nourished. No distress.  HENT:  Head: Normocephalic and atraumatic.  Post oropharyngeal erythema without edema or exudate. Uvula midline.  Eyes: Conjunctivae and EOM are normal.  Neck: Normal range of motion. Neck supple.  No meningeal signs.  Cardiovascular: Normal rate, regular rhythm and normal heart sounds.   Pulmonary/Chest: Effort normal. No respiratory distress.  Diffuse expiratory wheezes bilateral.  Musculoskeletal: Normal range of motion. She exhibits no edema.  Lymphadenopathy:    She has no cervical adenopathy.  Neurological: She is alert and oriented to person, place, and time. No sensory deficit.  Skin: Skin is warm and dry.  Psychiatric: She has a normal mood and affect. Her behavior is normal.  Nursing note and vitals reviewed.   ED Course  Procedures (including critical care time) DIAGNOSTIC STUDIES: Oxygen Saturation is 100% on RA, normal by my interpretation.  COORDINATION OF CARE: 7:58 PM- Will order rapid strep test. Will order nebulizer treatment. Pt verbalizes understanding and agrees to plan.  9:00 PM- At recheck pt does not appear to have improved with nebulizer treatment. Will repeat nebulizer treatment and order CXR.   Medications  albuterol (PROVENTIL HFA;VENTOLIN HFA) 108 (90 BASE) MCG/ACT inhaler 2 puff (not administered)  predniSONE  (DELTASONE) tablet 60 mg (not administered)  albuterol (PROVENTIL) (2.5 MG/3ML) 0.083% nebulizer solution 5 mg (5 mg Nebulization Given 01/14/15 2018)  albuterol (PROVENTIL) (2.5 MG/3ML) 0.083% nebulizer solution 5 mg (5 mg Nebulization Given 01/14/15 2136)  ipratropium (ATROVENT) nebulizer solution 0.5 mg (0.5 mg Nebulization Given 01/14/15 2135)  acetaminophen (TYLENOL) tablet 1,000 mg (1,000 mg Oral Given 01/14/15 2203)  sodium chloride 0.9 % bolus 1,000 mL (0 mLs Intravenous Stopped 01/14/15 2334)  sodium chloride 0.9 % bolus 1,000 mL (1,000 mLs Intravenous New Bag/Given 01/14/15 2333)  cefTRIAXone (ROCEPHIN) 1 g in dextrose 5 % 50 mL IVPB (0 g Intravenous Stopped 01/15/15 0003)    Labs Review Labs Reviewed  BASIC METABOLIC PANEL - Abnormal; Notable for the following:    Potassium 3.1 (*)    Glucose, Bld 105 (*)    GFR calc non Af Amer 90 (*)    All other components within normal limits  I-STAT CG4 LACTIC ACID, ED - Abnormal; Notable for the following:    Lactic Acid, Venous 3.55 (*)    All other components within normal limits  RAPID STREP SCREEN  CULTURE, GROUP A STREP  CULTURE, BLOOD (ROUTINE X 2)  CULTURE, BLOOD (ROUTINE X 2)  CBC  I-STAT CG4 LACTIC ACID, ED    Imaging Review Dg Chest 2 View  01/14/2015   CLINICAL DATA:  Acute onset of cough, fever, sore throat and shortness of breath. Initial encounter.  EXAM: CHEST  2 VIEW  COMPARISON:  None.  FINDINGS: The lungs are well-aerated and clear. There is no evidence of focal opacification, pleural effusion or pneumothorax.  The heart is normal in size; the mediastinal contour is within normal limits. No acute osseous abnormalities are seen.  IMPRESSION: No acute cardiopulmonary process seen.   Electronically Signed   By: Roanna Raider M.D.   On: 01/14/2015 21:40     EKG Interpretation None      MDM   Final diagnoses:  Acute bronchitis, unspecified organism  Other specified fever  Cough   Patient presenting with sore  throat, hoarse voice, intermittent fevers. Afebrile on arrival with stable vital signs. Wheezing noted on exam. Nebulizer treatment given. After nebulizer treatment, patient spiked a fever of 102.2, became tachycardic at 120. At that time, IV fluids were started and Tylenol given. Labs obtained. No leukocytosis. Lactic acid elevated at 3.55. Given another liter of fluids along with starting broad-spectrum antibiotics IV Rocephin. After 2 L of fluid, lactic acid normalized to 1.40. Wheezing still present after second neb treatment which was a DuoNeb, however improved from initial exam. Rapid strep negative. Chest x-ray negative. Fever improved and heart rate improved after IV fluids and Tylenol. States she is feeling much better. I do not feel patient requires admission as she is stable. Will discharge patient home on antibiotics, oral steroids, Phenergan/codeine cough syrup and albuterol inhaler. Advised follow-up with PCP within 1-2 days. Stable for discharge. Return precautions given. Patient states understanding of treatment care plan and is agreeable.  Discussed with attending Dr. Hyacinth Meeker who also evaluated patient and agrees with plan of care.  I personally performed the services described in  this documentation, which was scribed in my presence. The recorded information has been reviewed and is accurate.    Kathrynn SpeedRobyn M Sayaka Hoeppner, PA-C 01/15/15 0012  Eber HongBrian Miller, MD 01/15/15 [phone]

## 2015-01-14 NOTE — ED Notes (Signed)
Pt in c/o sore throat for the last few days, intermittent fever, has been taking ibuprofen regularly today so is unsure of temperature, reports body aches and chills, no distress noted

## 2015-01-15 MED ORDER — ALBUTEROL SULFATE HFA 108 (90 BASE) MCG/ACT IN AERS
2.0000 | INHALATION_SPRAY | Freq: Once | RESPIRATORY_TRACT | Status: AC
  Administered 2015-01-15: 2 via RESPIRATORY_TRACT
  Filled 2015-01-15: qty 6.7

## 2015-01-15 MED ORDER — PREDNISONE 20 MG PO TABS
60.0000 mg | ORAL_TABLET | Freq: Once | ORAL | Status: AC
  Administered 2015-01-15: 60 mg via ORAL
  Filled 2015-01-15: qty 3

## 2015-01-15 MED ORDER — AMOXICILLIN-POT CLAVULANATE 875-125 MG PO TABS: 1.0000 | ORAL_TABLET | Freq: Two times a day (BID) | ORAL | Status: DC

## 2015-01-15 MED ORDER — PREDNISONE 20 MG PO TABS: ORAL_TABLET | ORAL | Status: DC

## 2015-01-15 MED ORDER — PROMETHAZINE-CODEINE 6.25-10 MG/5ML PO SYRP: 5.0000 mL | ORAL_SOLUTION | Freq: Three times a day (TID) | ORAL | Status: DC | PRN

## 2015-01-15 NOTE — Discharge Instructions (Signed)
Take prednisone as prescribed beginning tomorrow as you were given the first dose in the emergency department today. Take Augmentin twice daily for 1 week. Rest and stay well-hydrated. Use inhaler every 4-6 hours as needed for cough and wheezing. Take Phenergan and codeine every 8 hours as directed for cough. No driving or operating heavy machinery while taking this drug as it may cause drowsiness.   Acute Bronchitis Bronchitis is inflammation of the airways that extend from the windpipe into the lungs (bronchi). The inflammation often causes mucus to develop. This leads to a cough, which is the most common symptom of bronchitis.  In acute bronchitis, the condition usually develops suddenly and goes away over time, usually in a couple weeks. Smoking, allergies, and asthma can make bronchitis worse. Repeated episodes of bronchitis may cause further lung problems.  CAUSES Acute bronchitis is most often caused by the same virus that causes a cold. The virus can spread from person to person (contagious) through coughing, sneezing, and touching contaminated objects. SIGNS AND SYMPTOMS   Cough.   Fever.   Coughing up mucus.   Body aches.   Chest congestion.   Chills.   Shortness of breath.   Sore throat.  DIAGNOSIS  Acute bronchitis is usually diagnosed through a physical exam. Your health care provider will also ask you questions about your medical history. Tests, such as chest X-rays, are sometimes done to rule out other conditions.  TREATMENT  Acute bronchitis usually goes away in a couple weeks. Oftentimes, no medical treatment is necessary. Medicines are sometimes given for relief of fever or cough. Antibiotic medicines are usually not needed but may be prescribed in certain situations. In some cases, an inhaler may be recommended to help reduce shortness of breath and control the cough. A cool mist vaporizer may also be used to help thin bronchial secretions and make it easier to  clear the chest.  HOME CARE INSTRUCTIONS  Get plenty of rest.   Drink enough fluids to keep your urine clear or pale yellow (unless you have a medical condition that requires fluid restriction). Increasing fluids may help thin your respiratory secretions (sputum) and reduce chest congestion, and it will prevent dehydration.   Take medicines only as directed by your health care provider.  If you were prescribed an antibiotic medicine, finish it all even if you start to feel better.  Avoid smoking and secondhand smoke. Exposure to cigarette smoke or irritating chemicals will make bronchitis worse. If you are a smoker, consider using nicotine gum or skin patches to help control withdrawal symptoms. Quitting smoking will help your lungs heal faster.   Reduce the chances of another bout of acute bronchitis by washing your hands frequently, avoiding people with cold symptoms, and trying not to touch your hands to your mouth, nose, or eyes.   Keep all follow-up visits as directed by your health care provider.  SEEK MEDICAL CARE IF: Your symptoms do not improve after 1 week of treatment.  SEEK IMMEDIATE MEDICAL CARE IF:  You develop an increased fever or chills.   You have chest pain.   You have severe shortness of breath.  You have bloody sputum.   You develop dehydration.  You faint or repeatedly feel like you are going to pass out.  You develop repeated vomiting.  You develop a severe headache. MAKE SURE YOU:   Understand these instructions.  Will watch your condition.  Will get help right away if you are not doing well or get worse.  Document Released: 11/20/2004 Document Revised: 02/27/2014 Document Reviewed: 04/05/2013 [facility] Patient Information 2015 [place], [place]. This information is not intended to replace advice given to you by your health care provider. Make sure you discuss any questions you have with your health care provider.  Fever, Adult A fever is a  higher than normal body temperature. In an adult, an oral temperature around 98.6 F (37 C) is considered normal. A temperature of 100.4 F (38 C) or higher is generally considered a fever. Mild or moderate fevers generally have no long-term effects and often do not require treatment. Extreme fever (greater than or equal to 106 F or 41.1 C) can cause seizures. The sweating that may occur with repeated or prolonged fever may cause dehydration. Elderly people can develop confusion during a fever. A measured temperature can vary with:  Age.  Time of day.  Method of measurement (mouth, underarm, rectal, or ear). The fever is confirmed by taking a temperature with a thermometer. Temperatures can be taken different ways. Some methods are accurate and some are not.  An oral temperature is used most commonly. Electronic thermometers are fast and accurate.  An ear temperature will only be accurate if the thermometer is positioned as recommended by the manufacturer.  A rectal temperature is accurate and done for those adults who have a condition where an oral temperature cannot be taken.  An underarm (axillary) temperature is not accurate and not recommended. Fever is a symptom, not a disease.  CAUSES   Infections commonly cause fever.  Some noninfectious causes for fever include:  Some arthritis conditions.  Some thyroid or adrenal gland conditions.  Some immune system conditions.  Some types of cancer.  A medicine reaction.  High doses of certain street drugs such as methamphetamine.  Dehydration.  Exposure to high outside or room temperatures.  Occasionally, the source of a fever cannot be determined. This is sometimes called a "fever of unknown origin" (FUO).  Some situations may lead to a temporary rise in body temperature that may go away on its own. Examples are:  Childbirth.  Surgery.  Intense exercise. HOME CARE INSTRUCTIONS   Take appropriate medicines for  fever. Follow dosing instructions carefully. If you use acetaminophen to reduce the fever, be careful to avoid taking other medicines that also contain acetaminophen. Do not take aspirin for a fever if you are younger than age 59. There is an association with Reye's syndrome. Reye's syndrome is a rare but potentially deadly disease.  If an infection is present and antibiotics have been prescribed, take them as directed. Finish them even if you start to feel better.  Rest as needed.  Maintain an adequate fluid intake. To prevent dehydration during an illness with prolonged or recurrent fever, you may need to drink extra fluid.Drink enough fluids to keep your urine clear or pale yellow.  Sponging or bathing with room temperature water may help reduce body temperature. Do not use ice water or alcohol sponge baths.  Dress comfortably, but do not over-bundle. SEEK MEDICAL CARE IF:   You are unable to keep fluids down.  You develop vomiting or diarrhea.  You are not feeling at least partly better after 3 days.  You develop new symptoms or problems. SEEK IMMEDIATE MEDICAL CARE IF:   You have shortness of breath or trouble breathing.  You develop excessive weakness.  You are dizzy or you faint.  You are extremely thirsty or you are making little or no urine.  You develop new pain  that was not there before (such as in the head, neck, chest, back, or abdomen).  You have persistent vomiting and diarrhea for more than 1 to 2 days.  You develop a stiff neck or your eyes become sensitive to light.  You develop a skin rash.  You have a fever or persistent symptoms for more than 2 to 3 days.  You have a fever and your symptoms suddenly get worse. MAKE SURE YOU:   Understand these instructions.  Will watch your condition.  Will get help right away if you are not doing well or get worse. Document Released: 04/08/2001 Document Revised: 02/27/2014 Document Reviewed: 08/14/2011 [facility]  Patient Information 2015 [place], [place]. This information is not intended to replace advice given to you by your health care provider. Make sure you discuss any questions you have with your health care provider.  Cough, Adult  A cough is a reflex that helps clear your throat and airways. It can help heal the body or may be a reaction to an irritated airway. A cough may only last 2 or 3 weeks (acute) or may last more than 8 weeks (chronic).  CAUSES Acute cough:  Viral or bacterial infections. Chronic cough:  Infections.  Allergies.  Asthma.  Post-nasal drip.  Smoking.  Heartburn or acid reflux.  Some medicines.  Chronic lung problems (COPD).  Cancer. SYMPTOMS   Cough.  Fever.  Chest pain.  Increased breathing rate.  High-pitched whistling sound when breathing (wheezing).  Colored mucus that you cough up (sputum). TREATMENT   A bacterial cough may be treated with antibiotic medicine.  A viral cough must run its course and will not respond to antibiotics.  Your caregiver may recommend other treatments if you have a chronic cough. HOME CARE INSTRUCTIONS   Only take over-the-counter or prescription medicines for pain, discomfort, or fever as directed by your caregiver. Use cough suppressants only as directed by your caregiver.  Use a cold steam vaporizer or humidifier in your bedroom or home to help loosen secretions.  Sleep in a semi-upright position if your cough is worse at night.  Rest as needed.  Stop smoking if you smoke. SEEK IMMEDIATE MEDICAL CARE IF:   You have pus in your sputum.  Your cough starts to worsen.  You cannot control your cough with suppressants and are losing sleep.  You begin coughing up blood.  You have difficulty breathing.  You develop pain which is getting worse or is uncontrolled with medicine.  You have a fever. MAKE SURE YOU:   Understand these instructions.  Will watch your condition.  Will get help right away  if you are not doing well or get worse. Document Released: 04/11/2011 Document Revised: 01/05/2012 Document Reviewed: 04/11/2011 [facility] Patient Information 2015 [place], [place]. This information is not intended to replace advice given to you by your health care provider. Make sure you discuss any questions you have with your health care provider.

## 2015-01-18 LAB — CULTURE, GROUP A STREP: STREP A CULTURE: NEGATIVE

## 2015-01-21 LAB — CULTURE, BLOOD (ROUTINE X 2)
CULTURE: NO GROWTH
CULTURE: NO GROWTH

## 2017-02-16 LAB — HM MAMMOGRAPHY

## 2018-01-11 ENCOUNTER — Telehealth: Payer: Self-pay | Admitting: Family Medicine

## 2018-01-11 NOTE — Telephone Encounter (Signed)
Would you take her back as a patient?  Look like it has been a long while.

## 2018-01-11 NOTE — Telephone Encounter (Unsigned)
Copied from CRM [id number]. Topic: Appointment Scheduling - New Patient >> Jan 11, 2018  2:34 PM Floria RavelingStovall, Shana A wrote: New patient has been scheduled for your office. Provider: Dr Laury AxonLowne  Date of Appointment: pt would said that she seen Dr Laury AxonLowne years ago and would like to know if she would take her back on as a new pt?    Best number [phone]  Route to department's PEC pool.

## 2018-01-12 NOTE — Telephone Encounter (Signed)
Unable to take new pt at this time  

## 2018-01-14 NOTE — Telephone Encounter (Signed)
Can you call patient to see if she maybe would like to see someone else.

## 2018-01-15 NOTE — Telephone Encounter (Signed)
Pt establish care with [facility].

## 2018-03-24 ENCOUNTER — Ambulatory Visit
Admission: RE | Admit: 2018-03-24 | Discharge: 2018-03-24 | Disposition: A | Discharge status: Home / Self Care | Payer: No Typology Code available for payment source | Source: Ambulatory Visit | Attending: Internal Medicine | Admitting: Internal Medicine

## 2018-03-24 ENCOUNTER — Other Ambulatory Visit: Payer: Self-pay | Admitting: Internal Medicine

## 2018-03-24 DIAGNOSIS — Z111 Encounter for screening for respiratory tuberculosis: Secondary | ICD-10-CM

## 2018-04-19 ENCOUNTER — Ambulatory Visit: Payer: 59 | Admitting: Nurse Practitioner

## 2018-04-19 ENCOUNTER — Encounter

## 2018-04-21 ENCOUNTER — Ambulatory Visit: Payer: 59 | Admitting: Nurse Practitioner

## 2018-04-30 ENCOUNTER — Ambulatory Visit: Payer: BLUE CROSS/BLUE SHIELD | Admitting: Nurse Practitioner

## 2018-04-30 ENCOUNTER — Encounter: Payer: Self-pay | Admitting: Nurse Practitioner

## 2018-04-30 ENCOUNTER — Other Ambulatory Visit (INDEPENDENT_AMBULATORY_CARE_PROVIDER_SITE_OTHER): Payer: BLUE CROSS/BLUE SHIELD

## 2018-04-30 VITALS — BP 114/72 | HR 67 | Temp 98.4°F | Resp 16 | Ht 64.0 in | Wt 146.0 lb

## 2018-04-30 DIAGNOSIS — L659 Nonscarring hair loss, unspecified: Secondary | ICD-10-CM

## 2018-04-30 DIAGNOSIS — E663 Overweight: Secondary | ICD-10-CM | POA: Diagnosis not present

## 2018-04-30 LAB — CBC
HCT: 38.9 % (ref 36.0–46.0)
Hemoglobin: 13.3 g/dL (ref 12.0–15.0)
MCHC: 34.1 g/dL (ref 30.0–36.0)
MCV: 93 fl (ref 78.0–100.0)
Platelets: 341 10*3/uL (ref 150.0–400.0)
RBC: 4.18 Mil/uL (ref 3.87–5.11)
RDW: 14.3 % (ref 11.5–15.5)
WBC: 4.6 10*3/uL (ref 4.0–10.5)

## 2018-04-30 LAB — COMPREHENSIVE METABOLIC PANEL
ALK PHOS: 34 U/L — AB (ref 39–117)
ALT: 13 U/L (ref 0–35)
AST: 14 U/L (ref 0–37)
Albumin: 4.1 g/dL (ref 3.5–5.2)
BILIRUBIN TOTAL: 1 mg/dL (ref 0.2–1.2)
BUN: 12 mg/dL (ref 6–23)
CO2: 26 mEq/L (ref 19–32)
Calcium: 9 mg/dL (ref 8.4–10.5)
Chloride: 104 mEq/L (ref 96–112)
Creatinine, Ser: 0.69 mg/dL (ref 0.40–1.20)
GFR: 120.31 mL/min (ref >60.00)
GLUCOSE: 95 mg/dL (ref 70–99)
Potassium: 3.9 mEq/L (ref 3.5–5.1)
Sodium: 138 mEq/L (ref 135–145)
TOTAL PROTEIN: 7.3 g/dL (ref 6.0–8.3)

## 2018-04-30 LAB — LIPID PANEL
CHOLESTEROL: 184 mg/dL (ref 0–200)
HDL: 102.1 mg/dL (ref >39.00)
LDL Cholesterol: 73 mg/dL (ref 0–99)
NonHDL: 81.48
Total CHOL/HDL Ratio: 2
Triglycerides: 42 mg/dL (ref 0.0–149.0)
VLDL: 8.4 mg/dL (ref 0.0–40.0)

## 2018-04-30 LAB — TSH: TSH: 1.42 u[IU]/mL (ref 0.35–4.50)

## 2018-04-30 LAB — IRON: IRON: 94 ug/dL (ref 42–145)

## 2018-04-30 LAB — FERRITIN: Ferritin: 33.1 ng/mL (ref 10.0–291.0)

## 2018-04-30 LAB — HEMOGLOBIN A1C: HEMOGLOBIN A1C: 5.8 % (ref 4.6–6.5)

## 2018-04-30 LAB — SEDIMENTATION RATE: SED RATE: 11 mm/h (ref 0–20)

## 2018-04-30 NOTE — Patient Instructions (Addendum)
Please head downstairs for lab work. If any of your test results are critically abnormal, you will be contacted right away. Otherwise, I will contact you within a week about your test results and any recommendations for abnormalities.  We will decide when you should come back and see me when I get your lab results back.  It was nice to meet you. Thanks for letting me take care of you today :)

## 2018-04-30 NOTE — Progress Notes (Signed)
Name: Tammy [name]   MRN: [id number]    DOB: [birth year]   Date:04/30/2018       Progress Note  Subjective  Chief Complaint  Chief Complaint  Patient presents with  . Establish Care    weight, hair loss    HPI  Tammy [name] is here today to establish care as a new patient to our practice.  She has not followed with PCP in many years, but is routinely following with [facility]'s health care and birth control management. Aside from birth control pills and OTC vitamin D and calcium supplement, she is not taking any other daily medications She would like to address two concerns: weight gain and hair loss.  Weight gain- She reports struggling to lose weight for years now, probably about 7 years, since having her last child. She feels like she can get down to around 146 lbs, where she is currently, and then can not lose anymore weight beyond that. She also feels like most of her excess weight is around her abdomen, and she was much smaller and more toned prior to having children. She tries to eat a healthy diet-  limiting carbs and sugars, eating lots of vegetables, drinking only water or sugar free drinks. She also tries to be active daily- jumping rope or climbing stairs around the house, but only gives dedicated time to workouts about 3 times per week due to  Being busy around house and with children.  Wt Readings from Last 3 Encounters:  04/30/18 146 lb (66.2 kg)  01/14/15 140 lb (63.5 kg)   Hair loss- This is not a new problem She has noticed receding hair line at forehead and patchy hair loss to posterior head for some time now She does wear her hair in locks and headdress daily for religious purposes She is maintained on birth control for peri-menopausal symptoms by GYN She has tried biotin with no improvement She says her father had similar balding at young age   Patient Active Problem List   Diagnosis Date Noted  . MYALGIA  09/27/2009    Past Surgical History:  Procedure Laterality Date  . CESAREAN SECTION    . TOE SURGERY    . WISDOM TOOTH EXTRACTION      No family history on file.  Social History   Socioeconomic History  . Marital status: Married    Spouse name: Not on file  . Number of children: Not on file  . Years of education: Not on file  . Highest education level: Not on file  Occupational History  . Not on file  Social Needs  . Financial resource strain: Not on file  . Food insecurity:    Worry: Not on file    Inability: Not on file  . Transportation needs:    Medical: Not on file    Non-medical: Not on file  Tobacco Use  . Smoking status: Never Smoker  . Smokeless tobacco: Never Used  Substance and Sexual Activity  . Alcohol use: Not Currently  . Drug use: Not Currently  . Sexual activity: Not on file  Lifestyle  . Physical activity:    Days per week: Not on file    Minutes per session: Not on file  . Stress: Not on file  Relationships  . Social connections:    Talks on phone: Not on file    Gets together: Not on file    Attends religious service: Not on file  Active member of club or organization: Not on file    Attends meetings of clubs or organizations: Not on file    Relationship status: Not on file  . Intimate partner violence:    Fear of current or ex partner: Not on file    Emotionally abused: Not on file    Physically abused: Not on file    Forced sexual activity: Not on file  Other Topics Concern  . Not on file  Social History Narrative  . Not on file     Current Outpatient Medications:  .  Calcium-Magnesium-Vitamin D (CALCIUM 500 PO), Take by mouth., Disp: , Rfl:  .  norgestimate-ethinyl estradiol (ORTHO-CYCLEN,SPRINTEC,PREVIFEM) 0.25-35 MG-MCG tablet, Take 1 tablet by mouth daily., Disp: , Rfl:   No Known Allergies   Review of Systems  Constitutional: Negative for chills, fever and malaise/fatigue.  Respiratory: Negative for shortness of  breath.   Cardiovascular: Negative for chest pain.  Gastrointestinal: Negative for abdominal pain, nausea and vomiting.  Musculoskeletal: Negative for falls, joint pain and myalgias.  Skin: Negative for rash.  Neurological: Negative for dizziness, tremors, loss of consciousness and weakness.  Psychiatric/Behavioral: The patient is not nervous/anxious.     Objective  Vitals:   04/30/18 0851  BP: 114/72  Pulse: 67  Resp: 16  Temp: 98.4 F (36.9 C)  TempSrc: Oral  SpO2: 96%  Weight: 146 lb (66.2 kg)  Height: 5\' 4"  (1.626 m)    Body mass index is 25.06 kg/m.  Physical Exam Vital signs reviewed Constitutional: Patient appears well-developed and well-nourished. No distress.  HENT: Head: Normocephalic and atraumatic.  Nose: Nose normal. Mouth/Throat: Oropharynx is clear and moist. No oropharyngeal exudate.  Eyes: Conjunctivae and EOM are normal. Pupils are equal, round, and reactive to light. No scleral icterus.  Neck: Normal range of motion. Neck supple. No thyromegaly present. No cervical adenopathy. Cardiovascular: Normal rate, regular rhythm and normal heart sounds.  No murmur heard. No BLE edema. Distal pulses intact. Pulmonary/Chest: Effort normal and breath sounds normal. No respiratory distress. Abdominal: Soft. Bowel sounds are normal, no distension. There is no tenderness. Musculoskeletal: Normal range of motion,  No gross deformities Neurological: She is alert and oriented to person, place, and time. No cranial nerve deficit. Coordination, balance, strength, speech and gait are normal.  Skin: Skin is warm and dry. No rash noted. No erythema. Frontal scalp hair loss. Psychiatric: Patient has a normal mood and affect. behavior is normal. Judgment and thought content normal.   Assessment & Plan F/U TBD pending lab results   1. Overweight (BMI 25.0-29.9) Her BMI is near normal range, which we discussed. We discussed possibly referring her to weight management but due to  her nearly normal BMI she may not be a candidate for medical weight management. We discussed goal of 150 minutes of moderate intensity exercise weekly to get to weight goal and tone her muscles, along with continued healthy diet  We discussed calorie counting and apps to use on phone such as myfitnesspal to help reach weight goal Labs today, she is fasting F/U with further recommendations pending lab results - TSH; Future - CBC; Future - Comprehensive metabolic panel; Future - Hemoglobin A1c; Future - Lipid panel; Future  2. Hair loss Discussed physical damage to hair from daily headdress and locks could contribute to some of her hair loss, but that other underlying causes could also play a role Update labs F/U with further recommendations pending lab results-will consider dermatology referral for further E&M pending labs  today - TSH; Future - Iron; Future - Ferritin; Future - Antinuclear Antib (ANA); Future - Sedimentation rate; Future

## 2018-05-03 ENCOUNTER — Other Ambulatory Visit: Payer: Self-pay | Admitting: Family

## 2018-05-03 ENCOUNTER — Telehealth: Payer: Self-pay | Admitting: Nurse Practitioner

## 2018-05-03 DIAGNOSIS — L659 Nonscarring hair loss, unspecified: Secondary | ICD-10-CM

## 2018-05-03 LAB — ANA: ANA: NEGATIVE

## 2018-05-03 NOTE — Telephone Encounter (Signed)
Copied from CRM [phone]. Topic: Quick Communication - Lab Results >> May 03, 2018  4:11 PM Barbette ReichmannWrenn, Taylor E, CMA wrote: Called patient to inform them of 04/30/18 lab results. When patient returns call, triage nurse may disclose results. >> May 03, 2018  4:40 PM Darletta MollLander, Lumin L wrote: No [facility] RN free for results.

## 2018-05-04 NOTE — Telephone Encounter (Signed)
States already received results

## 2018-05-05 ENCOUNTER — Encounter: Payer: Self-pay | Admitting: Nurse Practitioner

## 2018-05-17 ENCOUNTER — Ambulatory Visit: Payer: 59 | Admitting: Nurse Practitioner

## 2018-05-18 ENCOUNTER — Encounter: Payer: Self-pay | Admitting: Nurse Practitioner

## 2018-06-22 ENCOUNTER — Encounter: Payer: Self-pay | Admitting: Nurse Practitioner

## 2019-03-01 ENCOUNTER — Telehealth: Payer: Self-pay | Admitting: Emergency Medicine

## 2019-03-01 NOTE — Telephone Encounter (Signed)
Copied from CRM [id number]. Topic: Appointment Scheduling - Scheduling Inquiry for Clinic >> Mar 01, 2019 12:04 PM Leafy Ro wrote: Reason for CRM pt is calling and needs school physical by 04-06-2019. Pt also may need titers. Pt was seeing [employer]

## 2019-03-01 NOTE — Telephone Encounter (Signed)
Ok for [facility] and school physical at her convenience; please have pt bring any documentation of previous immunizations if she has this, and also documentation regarding any titers she may need

## 2019-03-01 NOTE — Telephone Encounter (Signed)
Spoke with pt to schedule appt.

## 2019-03-01 NOTE — Telephone Encounter (Signed)
Dr Jonny Ruiz would you be okay with doing a school physical? Virtual or in office?

## 2019-03-02 ENCOUNTER — Other Ambulatory Visit (INDEPENDENT_AMBULATORY_CARE_PROVIDER_SITE_OTHER): Payer: BLUE CROSS/BLUE SHIELD

## 2019-03-02 ENCOUNTER — Ambulatory Visit (INDEPENDENT_AMBULATORY_CARE_PROVIDER_SITE_OTHER): Payer: BLUE CROSS/BLUE SHIELD | Admitting: Internal Medicine

## 2019-03-02 ENCOUNTER — Ambulatory Visit (INDEPENDENT_AMBULATORY_CARE_PROVIDER_SITE_OTHER)
Admission: RE | Admit: 2019-03-02 | Discharge: 2019-03-02 | Disposition: A | Discharge status: Home / Self Care | Payer: BLUE CROSS/BLUE SHIELD | Source: Ambulatory Visit | Attending: Internal Medicine | Admitting: Internal Medicine

## 2019-03-02 ENCOUNTER — Encounter: Payer: Self-pay | Admitting: Internal Medicine

## 2019-03-02 ENCOUNTER — Other Ambulatory Visit: Payer: Self-pay

## 2019-03-02 VITALS — BP 102/64 | HR 76 | Temp 98.1°F | Ht 64.0 in | Wt 148.0 lb

## 2019-03-02 DIAGNOSIS — Z114 Encounter for screening for human immunodeficiency virus [HIV]: Secondary | ICD-10-CM

## 2019-03-02 DIAGNOSIS — E663 Overweight: Secondary | ICD-10-CM | POA: Insufficient documentation

## 2019-03-02 DIAGNOSIS — L659 Nonscarring hair loss, unspecified: Secondary | ICD-10-CM | POA: Insufficient documentation

## 2019-03-02 DIAGNOSIS — Z9289 Personal history of other medical treatment: Secondary | ICD-10-CM | POA: Diagnosis not present

## 2019-03-02 DIAGNOSIS — Z0000 Encounter for general adult medical examination without abnormal findings: Secondary | ICD-10-CM | POA: Diagnosis not present

## 2019-03-02 DIAGNOSIS — Z23 Encounter for immunization: Secondary | ICD-10-CM

## 2019-03-02 LAB — CBC WITH DIFFERENTIAL/PLATELET
Basophils Absolute: 0 10*3/uL (ref 0.0–0.1)
Basophils Relative: 0.9 % (ref 0.0–3.0)
Eosinophils Absolute: 0.1 10*3/uL (ref 0.0–0.7)
Eosinophils Relative: 1.8 % (ref 0.0–5.0)
HCT: 37.5 % (ref 36.0–46.0)
Hemoglobin: 13 g/dL (ref 12.0–15.0)
Lymphocytes Relative: 56.7 % — ABNORMAL HIGH (ref 12.0–46.0)
Lymphs Abs: 2.8 10*3/uL (ref 0.7–4.0)
MCHC: 34.6 g/dL (ref 30.0–36.0)
MCV: 93.5 fl (ref 78.0–100.0)
Monocytes Absolute: 0.4 10*3/uL (ref 0.1–1.0)
Monocytes Relative: 7.8 % (ref 3.0–12.0)
Neutro Abs: 1.6 10*3/uL (ref 1.4–7.7)
Neutrophils Relative %: 32.8 % — ABNORMAL LOW (ref 43.0–77.0)
Platelets: 354 10*3/uL (ref 150.0–400.0)
RBC: 4.01 Mil/uL (ref 3.87–5.11)
RDW: 14.8 % (ref 11.5–15.5)
WBC: 4.9 10*3/uL (ref 4.0–10.5)

## 2019-03-02 LAB — BASIC METABOLIC PANEL
BUN: 11 mg/dL (ref 6–23)
CO2: 23 mEq/L (ref 19–32)
Calcium: 8.6 mg/dL (ref 8.4–10.5)
Chloride: 105 mEq/L (ref 96–112)
Creatinine, Ser: 0.72 mg/dL (ref 0.40–1.20)
GFR: 107.33 mL/min (ref >60.00)
Glucose, Bld: 97 mg/dL (ref 70–99)
Potassium: 3.9 mEq/L (ref 3.5–5.1)
Sodium: 137 mEq/L (ref 135–145)

## 2019-03-02 LAB — LIPID PANEL
Cholesterol: 175 mg/dL (ref 0–200)
HDL: 84.3 mg/dL (ref >39.00)
LDL Cholesterol: 79 mg/dL (ref 0–99)
NonHDL: 90.94
Total CHOL/HDL Ratio: 2
Triglycerides: 58 mg/dL (ref 0.0–149.0)
VLDL: 11.6 mg/dL (ref 0.0–40.0)

## 2019-03-02 LAB — URINALYSIS, ROUTINE W REFLEX MICROSCOPIC
Bilirubin Urine: NEGATIVE
Ketones, ur: NEGATIVE
Leukocytes,Ua: NEGATIVE
Nitrite: NEGATIVE
Specific Gravity, Urine: 1.025 (ref 1.000–1.030)
Total Protein, Urine: NEGATIVE
Urine Glucose: NEGATIVE
Urobilinogen, UA: 0.2 (ref 0.0–1.0)
pH: 6.5 (ref 5.0–8.0)

## 2019-03-02 LAB — HEPATIC FUNCTION PANEL
ALT: 12 U/L (ref 0–35)
AST: 11 U/L (ref 0–37)
Albumin: 3.9 g/dL (ref 3.5–5.2)
Alkaline Phosphatase: 29 U/L — ABNORMAL LOW (ref 39–117)
Bilirubin, Direct: 0.1 mg/dL (ref 0.0–0.3)
Total Bilirubin: 0.6 mg/dL (ref 0.2–1.2)
Total Protein: 7.1 g/dL (ref 6.0–8.3)

## 2019-03-02 LAB — TSH: TSH: 1.49 u[IU]/mL (ref 0.35–4.50)

## 2019-03-02 NOTE — Patient Instructions (Signed)
You had the Tdap tetanus shot today  Please continue all other medications as before  Please have the pharmacy call with any other refills you may need.  Please continue your efforts at being more active, low cholesterol diet, and weight control.  You are otherwise up to date with prevention measures today.  Please keep your appointments with your specialists as you may have planned  Please go to the XRAY Department in the Basement (go straight as you get off the elevator) for the x-ray testing  Please go to the LAB in the Basement (turn left off the elevator) for the tests to be done today  You will be contacted by phone if any changes need to be made immediately.  Otherwise, you will receive a letter about your results with an explanation, but please check with MyChart first.  Please remember to sign up for MyChart if you have not done so, as this will be important to you in the future with finding out test results, communicating by private email, and scheduling acute appointments online when needed.  Please return in 1 year for your yearly visit, or sooner if needed, with Lab testing done 3-5 days before

## 2019-03-02 NOTE — Assessment & Plan Note (Signed)
Also for cxr 

## 2019-03-02 NOTE — Progress Notes (Signed)
Subjective:    Patient ID: Tammy [name], female    DOB: 1976-03-06, 43 y.o.   MRN: [id number]  HPI  Here for wellness and f/u;  Overall doing ok;  Pt denies Chest pain, worsening SOB, DOE, wheezing, orthopnea, PND, worsening LE edema, palpitations, dizziness or syncope.  Pt denies neurological change such as new headache, facial or extremity weakness.  Pt denies polydipsia, polyuria, or low sugar symptoms. Pt states overall good compliance with treatment and medications, good tolerability, and has been trying to follow appropriate diet.  Pt denies worsening depressive symptoms, suicidal ideation or panic. No fever, night sweats, wt loss, loss of appetite, or other constitutional symptoms.  Pt states good ability with ADL's, has low fall risk, home safety reviewed and adequate, no other significant changes in hearing or vision, and only occasionally active with exercise. Born [place], lost her immunization card.  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 Has Hx of + PPD, needs CXR for clearance.   Past Medical History:  Diagnosis Date  . History of chicken pox    Past Surgical History:  Procedure Laterality Date  . CESAREAN SECTION    . TOE SURGERY    . WISDOM TOOTH EXTRACTION      reports that she has never smoked. She has never used smokeless tobacco. She reports previous alcohol use. She reports previous drug use. family history includes Cancer in her father; Diabetes in her maternal grandfather and mother; Early death in her father; Hypertension in her maternal grandmother; Mental illness in her mother; Miscarriages / Stillbirths in her mother. No Known Allergies Current Outpatient Medications on File Prior to Visit  Medication Sig Dispense Refill  . Calcium-Magnesium-Vitamin D (CALCIUM 500 PO) Take by mouth.    . Estradiol 10 % CREA by Does not apply route.    . norgestimate-ethinyl estradiol (ORTHO-CYCLEN,SPRINTEC,PREVIFEM) 0.25-35 MG-MCG tablet Take 1 tablet by  mouth daily.     No current facility-administered medications on file prior to visit.    Review of Systems Constitutional: Negative for other unusual diaphoresis, sweats, appetite or weight changes HENT: Negative for other worsening hearing loss, ear pain, facial swelling, mouth sores or neck stiffness.   Eyes: Negative for other worsening pain, redness or other visual disturbance.  Respiratory: Negative for other stridor or swelling Cardiovascular: Negative for other palpitations or other chest pain  Gastrointestinal: Negative for worsening diarrhea or loose stools, blood in stool, distention or other pain Genitourinary: Negative for hematuria, flank pain or other change in urine volume.  Musculoskeletal: Negative for myalgias or other joint swelling.  Skin: Negative for other color change, or other wound or worsening drainage.  Neurological: Negative for other syncope or numbness. Hematological: Negative for other adenopathy or swelling Psychiatric/Behavioral: Negative for hallucinations, other worsening agitation, SI, self-injury, or new decreased concentration All other system neg per pt    Objective:   Physical Exam BP 102/64   Pulse 76   Temp 98.1 F (36.7 C) (Oral)   Ht 5\' 4"  (1.626 m)   Wt 148 lb (67.1 kg)   SpO2 99%   BMI 25.40 kg/m  VS noted,  Constitutional: Pt is oriented to person, place, and time. Appears well-developed and well-nourished, in no significant distress and comfortable Head: Normocephalic and atraumatic  Eyes: Conjunctivae and EOM are normal. Pupils are equal, round, and reactive to light Right Ear: External ear normal without discharge Left Ear: External ear normal without discharge Nose: Nose without discharge or deformity Mouth/Throat:  Oropharynx is without other ulcerations and moist  Neck: Normal range of motion. Neck supple. No JVD present. No tracheal deviation present or significant neck LA or mass Cardiovascular: Normal rate, regular rhythm,  normal heart sounds and intact distal pulses.   Pulmonary/Chest: WOB normal and breath sounds without rales or wheezing  Abdominal: Soft. Bowel sounds are normal. NT. No HSM  Musculoskeletal: Normal range of motion. Exhibits no edema Lymphadenopathy: Has no other cervical adenopathy.  Neurological: Pt is alert and oriented to person, place, and time. Pt has normal reflexes. No cranial nerve deficit. Motor grossly intact, Gait intact Skin: Skin is warm and dry. No rash noted or new ulcerations Psychiatric:  Has normal mood and affect. Behavior is normal without agitation No other exam findings  Lab Results  Component Value Date   WBC 4.6 04/30/2018   HGB 13.3 04/30/2018   HCT 38.9 04/30/2018   PLT 341.0 04/30/2018   GLUCOSE 95 04/30/2018   CHOL 184 04/30/2018   TRIG 42.0 04/30/2018   HDL 102.10 04/30/2018   LDLDIRECT 75.6 10/04/2009   LDLCALC 73 04/30/2018   ALT 13 04/30/2018   AST 14 04/30/2018   NA 138 04/30/2018   K 3.9 04/30/2018   CL 104 04/30/2018   CREATININE 0.69 04/30/2018   BUN 12 04/30/2018   CO2 26 04/30/2018   TSH 1.42 04/30/2018   HGBA1C 5.8 04/30/2018       Assessment & Plan:

## 2019-03-02 NOTE — Assessment & Plan Note (Signed)

## 2019-03-02 NOTE — Telephone Encounter (Signed)
Shirron, please to mail information to pt when available:  TB form that you already have  CXR results from Epic  All test results from today,  thanks

## 2019-03-03 LAB — HIV ANTIBODY (ROUTINE TESTING W REFLEX): HIV 1&2 Ab, 4th Generation: NONREACTIVE

## 2019-12-28 ENCOUNTER — Telehealth: Payer: Self-pay

## 2019-12-28 NOTE — Telephone Encounter (Signed)
New message   The patient wants to transfer services over to a female provider at [facility] NP Merlene Laughter.    Please advise.

## 2019-12-28 NOTE — Telephone Encounter (Signed)
Ok with me 

## 2019-12-30 NOTE — Telephone Encounter (Signed)
Patient is calling and requesting a TOC to [facility] from [employer]. Pls advise. CB is [phone]

## 2019-12-30 NOTE — Telephone Encounter (Signed)
ok 

## 2020-01-02 NOTE — Telephone Encounter (Signed)
Please help pt schedule TOC appt with new PCP.

## 2020-01-04 NOTE — Telephone Encounter (Signed)
Scheduled

## 2020-01-12 ENCOUNTER — Ambulatory Visit: Payer: Self-pay | Attending: Internal Medicine

## 2020-01-12 DIAGNOSIS — Z23 Encounter for immunization: Secondary | ICD-10-CM

## 2020-01-12 NOTE — Progress Notes (Signed)
   Covid-19 Vaccination Clinic  Name:  Tammy [name]    MRN: [id number] DOB: [birth year]  01/12/2020  Tammy [name] was observed post Covid-19 immunization for 15 minutes without incident. She was provided with Vaccine Information Sheet and instruction to access the V-Safe system.   Tammy [name] was instructed to call 911 with any severe reactions post vaccine: Marland Kitchen Difficulty breathing  . Swelling of face and throat  . A fast heartbeat  . A bad rash all over body  . Dizziness and weakness   Immunizations Administered    Name Date Dose VIS Date Route   Pfizer COVID-19 Vaccine 01/12/2020 12:19 PM 0.3 mL 10/07/2019 Intramuscular   Manufacturer: [employer], [employer]   Lot: [id number]   NDC: [address]-7

## 2020-02-06 ENCOUNTER — Ambulatory Visit: Payer: Self-pay | Attending: Internal Medicine

## 2020-02-06 DIAGNOSIS — Z23 Encounter for immunization: Secondary | ICD-10-CM

## 2020-02-06 NOTE — Progress Notes (Signed)
   Covid-19 Vaccination Clinic  Name:  Tammy [name]    MRN: [id number] DOB: 07/05/1976  02/06/2020  Tammy [name] was observed post Covid-19 immunization for 15 minutes without incident. She was provided with Vaccine Information Sheet and instruction to access the V-Safe system.   Tammy [name] was instructed to call 911 with any severe reactions post vaccine: Marland Kitchen Difficulty breathing  . Swelling of face and throat  . A fast heartbeat  . A bad rash all over body  . Dizziness and weakness   Immunizations Administered    Name Date Dose VIS Date Route   Pfizer COVID-19 Vaccine 02/06/2020 10:37 AM 0.3 mL 10/07/2019 Intramuscular   Manufacturer: [employer], [employer]   Lot: [id number]   NDC: [address]-2

## 2020-03-02 ENCOUNTER — Encounter: Payer: BLUE CROSS/BLUE SHIELD | Admitting: Internal Medicine

## 2020-03-05 ENCOUNTER — Encounter: Payer: 59 | Admitting: Nurse Practitioner

## 2020-03-07 ENCOUNTER — Other Ambulatory Visit: Payer: Self-pay

## 2020-03-08 ENCOUNTER — Ambulatory Visit (INDEPENDENT_AMBULATORY_CARE_PROVIDER_SITE_OTHER): Payer: 59 | Admitting: Nurse Practitioner

## 2020-03-08 ENCOUNTER — Encounter: Payer: Self-pay | Admitting: Nurse Practitioner

## 2020-03-08 VITALS — BP 110/60 | HR 78 | Temp 97.4°F | Ht 64.17 in | Wt 152.6 lb

## 2020-03-08 DIAGNOSIS — Z111 Encounter for screening for respiratory tuberculosis: Secondary | ICD-10-CM | POA: Diagnosis not present

## 2020-03-08 DIAGNOSIS — Z136 Encounter for screening for cardiovascular disorders: Secondary | ICD-10-CM

## 2020-03-08 DIAGNOSIS — Z0000 Encounter for general adult medical examination without abnormal findings: Secondary | ICD-10-CM

## 2020-03-08 DIAGNOSIS — Z13220 Encounter for screening for lipoid disorders: Secondary | ICD-10-CM

## 2020-03-08 NOTE — Progress Notes (Signed)
Subjective:    Patient ID: Tammy [name], female    DOB: 1975-10-29, 44 y.o.   MRN: [id number]  Patient presents today for complete physical and completion of school form.  HPI  Sexual History (orientation,birth control, marital status, STD):married, sexually active, up to date with pelvic and breast exam per patient (done by GYN)  Depression/Suicide: Depression screen [facility] 2/9 03/08/2020 03/02/2019  Decreased Interest 0 0  Down, Depressed, Hopeless 0 0  PHQ - 2 Score 0 0   Vision:not needed  Dental:up to date  Immunizations: (TDAP, Hep C screen, Pneumovax, Influenza, zoster)  Health Maintenance  Topic Date Due  . Pap Smear  02/11/2020  . Flu Shot  05/27/2020  . Tetanus Vaccine  03/01/2029  . COVID-19 Vaccine  Completed  . HIV Screening  Completed   Diet:heart healthy.  Weight:  Wt Readings from Last 3 Encounters:  03/08/20 152 lb 9.6 oz (69.2 kg)  03/02/19 148 lb (67.1 kg)  04/30/18 146 lb (66.2 kg)   Exercise:cardio and strengthening exercise daily  Fall Risk: Fall Risk  03/08/2020 03/02/2019  Falls in the past year? 0 0  Number falls in past yr: 0 0  Injury with Fall? 0 0   Medications and allergies reviewed with patient and updated if appropriate.  Patient Active Problem List   Diagnosis Date Noted  . Preventative health care 03/02/2019  . Alopecia 03/02/2019  . 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 norgestimate-ethinyl estradiol (ORTHO-CYCLEN,SPRINTEC,PREVIFEM) 0.25-35 MG-MCG tablet Take 1 tablet by mouth daily.    . Calcium-Magnesium-Vitamin D (CALCIUM 500 PO) Take by mouth.     No current facility-administered medications on file prior to visit.    Past Medical History:  Diagnosis Date  . History of chicken pox     Past Surgical History:  Procedure Laterality Date  . CESAREAN SECTION    . TOE SURGERY    . WISDOM TOOTH EXTRACTION      Social History     Socioeconomic History  . Marital status: Married    Spouse name: Not on file  . Number of children: Not on file  . Years of education: Not on file  . Highest education level: Not on file  Occupational History  . Not on file  Tobacco Use  . Smoking status: Never Smoker  . Smokeless tobacco: Never Used  Substance and Sexual Activity  . Alcohol use: Not Currently  . Drug use: Not Currently  . Sexual activity: Not on file  Other Topics Concern  . Not on file  Social History Narrative  . Not on file   Social Determinants of Health   Financial Resource Strain:   . Difficulty of Paying Living Expenses:   Food Insecurity:   . Worried About Programme researcher, broadcasting/film/video in the Last Year:   . Barista in the Last Year:   Transportation Needs:   . Freight forwarder (Medical):   Marland Kitchen Lack of Transportation (Non-Medical):   Physical Activity:   . Days of Exercise per Week:   . Minutes of Exercise per Session:   Stress:   . Feeling of Stress :   Social Connections:   . Frequency of Communication with Friends and Family:   . Frequency of Social Gatherings with Friends and Family:   . Attends Religious Services:   . Active Member of Clubs or Organizations:   . Attends  Club or Organization Meetings:   Marland Kitchen Marital Status:     Family History  Problem Relation Age of Onset  . Diabetes Mother   . Miscarriages / [place] Mother   . Mental illness Mother   . Cancer Father   . Early death Father   . Hypertension Maternal Grandmother   . Diabetes Maternal Grandfather         Review of Systems  Constitutional: Negative for fever, malaise/fatigue and weight loss.  HENT: Negative for congestion and sore throat.   Eyes:       Negative for visual changes  Respiratory: Negative for cough and shortness of breath.   Cardiovascular: Negative for chest pain, palpitations and leg swelling.  Gastrointestinal: Negative for blood in stool, constipation, diarrhea and heartburn.   Genitourinary: Negative for dysuria, frequency and urgency.  Musculoskeletal: Negative for falls, joint pain and myalgias.  Skin: Negative for rash.  Neurological: Negative for dizziness, sensory change and headaches.  Endo/Heme/Allergies: Does not bruise/bleed easily.  Psychiatric/Behavioral: Negative for depression, substance abuse and suicidal ideas. The patient is not nervous/anxious.     Objective:   Vitals:   03/08/20 1406  BP: 110/60  Pulse: 78  Temp: (!) 97.4 F (36.3 C)  SpO2: 98%    Body mass index is 26.05 kg/m.   Physical Examination:  Physical Exam Vitals reviewed.  Constitutional:      General: She is not in acute distress.    Appearance: She is well-developed.  HENT:     Right Ear: Tympanic membrane, ear canal and external ear normal.     Left Ear: Tympanic membrane, ear canal and external ear normal.  Eyes:     Extraocular Movements: Extraocular movements intact.     Conjunctiva/sclera: Conjunctivae normal.  Neck:     Thyroid: No thyroid mass, thyromegaly or thyroid tenderness.  Cardiovascular:     Rate and Rhythm: Normal rate and regular rhythm.     Heart sounds: Normal heart sounds.  Pulmonary:     Effort: Pulmonary effort is normal. No respiratory distress.     Breath sounds: Normal breath sounds.  Chest:     Chest wall: No tenderness.  Abdominal:     General: Bowel sounds are normal.     Palpations: Abdomen is soft.  Genitourinary:    Comments: Breast and pelvic exam Deferred to GYN per patient Musculoskeletal:        General: Normal range of motion.     Cervical back: Normal range of motion and neck supple.     Right lower leg: No edema.     Left lower leg: No edema.  Lymphadenopathy:     Cervical: No cervical adenopathy.  Skin:    General: Skin is warm and dry.  Neurological:     Mental Status: She is alert and oriented to person, place, and time.     Deep Tendon Reflexes: Reflexes are normal and symmetric.  Psychiatric:         Mood and Affect: Mood normal.        Behavior: Behavior normal.        Thought Content: Thought content normal.     ASSESSMENT and PLAN: This visit occurred during the SARS-CoV-2 public health emergency.  Safety protocols were in place, including screening questions prior to the visit, additional usage of staff PPE, and extensive cleaning of exam room while observing appropriate contact time as indicated for disinfecting solutions.   Willette was seen today for establish care.  Diagnoses and all  orders for this visit:  Preventative health care -     Lipid panel -     Comprehensive metabolic panel -     CBC w/Diff -     QuantiFERON-TB Gold Plus -     TSH  Encounter for lipid screening for cardiovascular disease -     Lipid panel  Screening-pulmonary TB -     QuantiFERON-TB Gold Plus   No problem-specific Assessment & Plan notes found for this encounter.     Problem List Items Addressed This Visit      Other   Preventative health care - Primary   Relevant Orders   Lipid panel   Comprehensive metabolic panel   CBC w/Diff   QuantiFERON-TB Gold Plus   TSH    Other Visit Diagnoses    Encounter for lipid screening for cardiovascular disease       Relevant Orders   Lipid panel   Screening-pulmonary TB       Relevant Orders   QuantiFERON-TB Gold Plus      Follow up: Return if symptoms worsen or fail to improve.  Alysia Penna, NP

## 2020-03-08 NOTE — Patient Instructions (Addendum)
Go to lab for blood draw. Will call once form is completed.   Health Maintenance, Female Adopting a healthy lifestyle and getting preventive care are important in promoting health and wellness. Ask your health care provider about:  The right schedule for you to have regular tests and exams.  Things you can do on your own to prevent diseases and keep yourself healthy. What should I know about diet, weight, and exercise? Eat a healthy diet   Eat a diet that includes plenty of vegetables, fruits, low-fat dairy products, and lean protein.  Do not eat a lot of foods that are high in solid fats, added sugars, or sodium. Maintain a healthy weight Body mass index (BMI) is used to identify weight problems. It estimates body fat based on height and weight. Your health care provider can help determine your BMI and help you achieve or maintain a healthy weight. Get regular exercise Get regular exercise. This is one of the most important things you can do for your health. Most adults should:  Exercise for at least 150 minutes each week. The exercise should increase your heart rate and make you sweat (moderate-intensity exercise).  Do strengthening exercises at least twice a week. This is in addition to the moderate-intensity exercise.  Spend less time sitting. Even light physical activity can be beneficial. Watch cholesterol and blood lipids Have your blood tested for lipids and cholesterol at 43 years of age, then have this test every 5 years. Have your cholesterol levels checked more often if:  Your lipid or cholesterol levels are high.  You are older than 43 years of age.  You are at high risk for heart disease. What should I know about cancer screening? Depending on your health history and family history, you may need to have cancer screening at various ages. This may include screening for:  Breast cancer.  Cervical cancer.  Colorectal cancer.  Skin cancer.  Lung cancer. What  should I know about heart disease, diabetes, and high blood pressure? Blood pressure and heart disease  High blood pressure causes heart disease and increases the risk of stroke. This is more likely to develop in people who have high blood pressure readings, are of African descent, or are overweight.  Have your blood pressure checked: ? Every 3-5 years if you are 44-52 years of age. ? Every year if you are 60 years old or older. Diabetes Have regular diabetes screenings. This checks your fasting blood sugar level. Have the screening done:  Once every three years after age 73 if you are at a normal weight and have a low risk for diabetes.  More often and at a younger age if you are overweight or have a high risk for diabetes. What should I know about preventing infection? Hepatitis B If you have a higher risk for hepatitis B, you should be screened for this virus. Talk with your health care provider to find out if you are at risk for hepatitis B infection. Hepatitis C Testing is recommended for:  Everyone born from [birth year] through 1964.  Anyone with known risk factors for hepatitis C. Sexually transmitted infections (STIs)  Get screened for STIs, including gonorrhea and chlamydia, if: ? You are sexually active and are younger than 43 years of age. ? You are older than 43 years of age and your health care provider tells you that you are at risk for this type of infection. ? Your sexual activity has changed since you were last screened, and you  are at increased risk for chlamydia or gonorrhea. Ask your health care provider if you are at risk.  Ask your health care provider about whether you are at high risk for HIV. Your health care provider may recommend a prescription medicine to help prevent HIV infection. If you choose to take medicine to prevent HIV, you should first get tested for HIV. You should then be tested every 3 months for as long as you are taking the medicine. Pregnancy  If  you are about to stop having your period (premenopausal) and you may become pregnant, seek counseling before you get pregnant.  Take 400 to 800 micrograms (mcg) of folic acid every day if you become pregnant.  Ask for birth control (contraception) if you want to prevent pregnancy. Osteoporosis and menopause Osteoporosis is a disease in which the bones lose minerals and strength with aging. This can result in bone fractures. If you are 20 years old or older, or if you are at risk for osteoporosis and fractures, ask your health care provider if you should:  Be screened for bone loss.  Take a calcium or vitamin D supplement to lower your risk of fractures.  Be given hormone replacement therapy (HRT) to treat symptoms of menopause. Follow these instructions at home: Lifestyle  Do not use any products that contain nicotine or tobacco, such as cigarettes, e-cigarettes, and chewing tobacco. If you need help quitting, ask your health care provider.  Do not use street drugs.  Do not share needles.  Ask your health care provider for help if you need support or information about quitting drugs. Alcohol use  Do not drink alcohol if: ? Your health care provider tells you not to drink. ? You are pregnant, may be pregnant, or are planning to become pregnant.  If you drink alcohol: ? Limit how much you use to 0-1 drink a day. ? Limit intake if you are breastfeeding.  Be aware of how much alcohol is in your drink. In the [place] one drink equals one 12 oz bottle of beer (355 mL), one 5 oz glass of wine (148 mL), or one 1 oz glass of hard liquor (44 mL). General instructions  Schedule regular health, dental, and eye exams.  Stay current with your vaccines.  Tell your health care provider if: ? You often feel depressed. ? You have ever been abused or do not feel safe at home. Summary  Adopting a healthy lifestyle and getting preventive care are important in promoting health and  wellness.  Follow your health care provider's instructions about healthy diet, exercising, and getting tested or screened for diseases.  Follow your health care provider's instructions on monitoring your cholesterol and blood pressure. This information is not intended to replace advice given to you by your health care provider. Make sure you discuss any questions you have with your health care provider. Document Revised: 10/06/2018 Document Reviewed: 10/06/2018 Elsevier Patient Education  2020 [employer].

## 2020-03-09 LAB — COMPREHENSIVE METABOLIC PANEL
ALT: 11 U/L (ref 0–35)
AST: 12 U/L (ref 0–37)
Albumin: 4.2 g/dL (ref 3.5–5.2)
Alkaline Phosphatase: 33 U/L — ABNORMAL LOW (ref 39–117)
BUN: 11 mg/dL (ref 6–23)
CO2: 27 mEq/L (ref 19–32)
Calcium: 9.2 mg/dL (ref 8.4–10.5)
Chloride: 104 mEq/L (ref 96–112)
Creatinine, Ser: 0.69 mg/dL (ref 0.40–1.20)
GFR: 112.19 mL/min (ref >60.00)
Glucose, Bld: 96 mg/dL (ref 70–99)
Potassium: 4.2 mEq/L (ref 3.5–5.1)
Sodium: 136 mEq/L (ref 135–145)
Total Bilirubin: 0.7 mg/dL (ref 0.2–1.2)
Total Protein: 7.2 g/dL (ref 6.0–8.3)

## 2020-03-09 LAB — CBC WITH DIFFERENTIAL/PLATELET
Basophils Absolute: 0.1 10*3/uL (ref 0.0–0.1)
Basophils Relative: 1 % (ref 0.0–3.0)
Eosinophils Absolute: 0.1 10*3/uL (ref 0.0–0.7)
Eosinophils Relative: 1.1 % (ref 0.0–5.0)
HCT: 37.3 % (ref 36.0–46.0)
Hemoglobin: 12.8 g/dL (ref 12.0–15.0)
Lymphocytes Relative: 44.7 % (ref 12.0–46.0)
Lymphs Abs: 2.8 10*3/uL (ref 0.7–4.0)
MCHC: 34.2 g/dL (ref 30.0–36.0)
MCV: 93.7 fl (ref 78.0–100.0)
Monocytes Absolute: 0.4 10*3/uL (ref 0.1–1.0)
Monocytes Relative: 6 % (ref 3.0–12.0)
Neutro Abs: 2.9 10*3/uL (ref 1.4–7.7)
Neutrophils Relative %: 47.2 % (ref 43.0–77.0)
Platelets: 355 10*3/uL (ref 150.0–400.0)
RBC: 3.98 Mil/uL (ref 3.87–5.11)
RDW: 14.4 % (ref 11.5–15.5)
WBC: 6.2 10*3/uL (ref 4.0–10.5)

## 2020-03-09 LAB — LIPID PANEL
Cholesterol: 191 mg/dL (ref 0–200)
HDL: 88.5 mg/dL (ref >39.00)
LDL Cholesterol: 96 mg/dL (ref 0–99)
NonHDL: 102.78
Total CHOL/HDL Ratio: 2
Triglycerides: 34 mg/dL (ref 0.0–149.0)
VLDL: 6.8 mg/dL (ref 0.0–40.0)

## 2020-03-09 LAB — TSH: TSH: 0.62 u[IU]/mL (ref 0.35–4.50)

## 2020-03-10 LAB — QUANTIFERON-TB GOLD PLUS
Mitogen-NIL: >10 IU/mL
NIL: 0.1 IU/mL
QuantiFERON-TB Gold Plus: POSITIVE — AB
TB1-NIL: 6.78 IU/mL
TB2-NIL: 6.06 IU/mL

## 2020-03-11 ENCOUNTER — Encounter: Payer: Self-pay | Admitting: Nurse Practitioner

## 2020-03-12 ENCOUNTER — Other Ambulatory Visit: Payer: Self-pay | Admitting: Nurse Practitioner

## 2020-03-12 ENCOUNTER — Encounter: Payer: Self-pay | Admitting: Nurse Practitioner

## 2020-03-12 DIAGNOSIS — R7612 Nonspecific reaction to cell mediated immunity measurement of gamma interferon antigen response without active tuberculosis: Secondary | ICD-10-CM

## 2020-03-13 ENCOUNTER — Other Ambulatory Visit: Payer: Self-pay

## 2020-03-14 ENCOUNTER — Other Ambulatory Visit: Payer: 59

## 2020-03-14 ENCOUNTER — Ambulatory Visit (INDEPENDENT_AMBULATORY_CARE_PROVIDER_SITE_OTHER): Payer: 59

## 2020-03-14 DIAGNOSIS — R7612 Nonspecific reaction to cell mediated immunity measurement of gamma interferon antigen response without active tuberculosis: Secondary | ICD-10-CM

## 2020-03-20 ENCOUNTER — Encounter: Payer: 59 | Admitting: Nurse Practitioner

## 2020-04-10 ENCOUNTER — Encounter: Payer: Self-pay | Admitting: Nurse Practitioner

## 2020-08-30 ENCOUNTER — Telehealth: Payer: Self-pay | Admitting: Nurse Practitioner

## 2020-08-30 DIAGNOSIS — Z3041 Encounter for surveillance of contraceptive pills: Secondary | ICD-10-CM

## 2020-08-30 NOTE — Telephone Encounter (Signed)
Pt is no longer seeing her OBGYN and wanted to know if Claris Gower can do her PAP and wanted to know if she can do it if it can just be done when she has her CPE in May, She also wanted to know if you can fill her sprintec up until she has her appt or if you would need to see her before hand to do so. If you can fill she said her pharmacy is [facility] [address], [place], [place] [address]

## 2020-08-30 NOTE — Telephone Encounter (Signed)
Please advise 

## 2020-08-31 DIAGNOSIS — Z3041 Encounter for surveillance of contraceptive pills: Secondary | ICD-10-CM | POA: Insufficient documentation

## 2020-08-31 MED ORDER — NORGESTIMATE-ETH ESTRADIOL 0.25-35 MG-MCG PO TABS: 1.0000 | ORAL_TABLET | Freq: Every day | ORAL | 5 refills | Status: DC

## 2020-08-31 NOTE — Telephone Encounter (Signed)
sprintec sent with 5refills Yes, I can perform pelvic and breast exam during next physical

## 2021-02-15 ENCOUNTER — Other Ambulatory Visit: Payer: Self-pay | Admitting: Nurse Practitioner

## 2021-02-15 DIAGNOSIS — Z3041 Encounter for surveillance of contraceptive pills: Secondary | ICD-10-CM

## 2021-03-10 IMAGING — DX DG CHEST 2V
2 series · 2 of 2 positions shown · non-contrast
Comparison: 03/02/2019

CLINICAL DATA: Asymptomatic.  Positive QuantiFERON test.

EXAM:
CHEST - 2 VIEW

[chest pa]
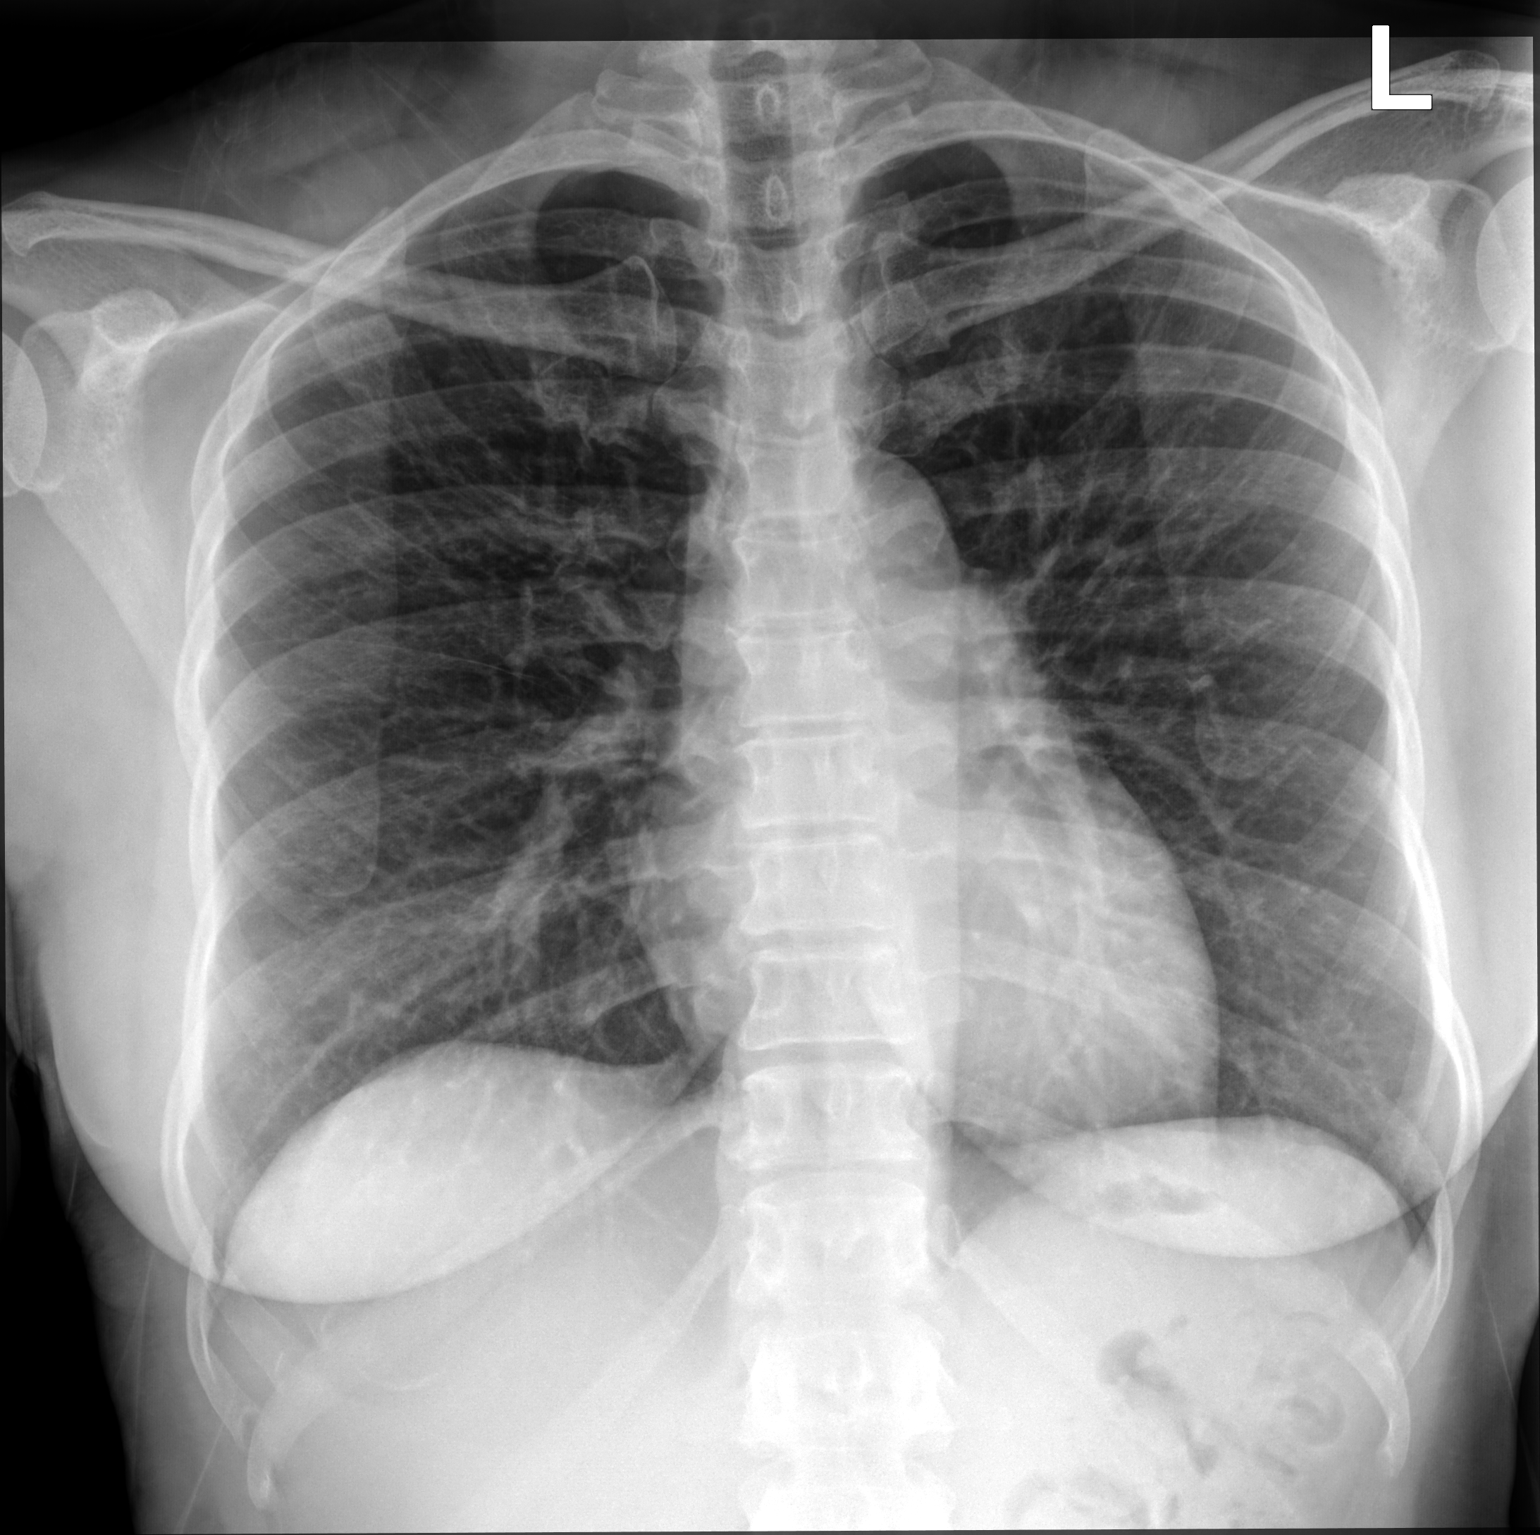

[chest lat]
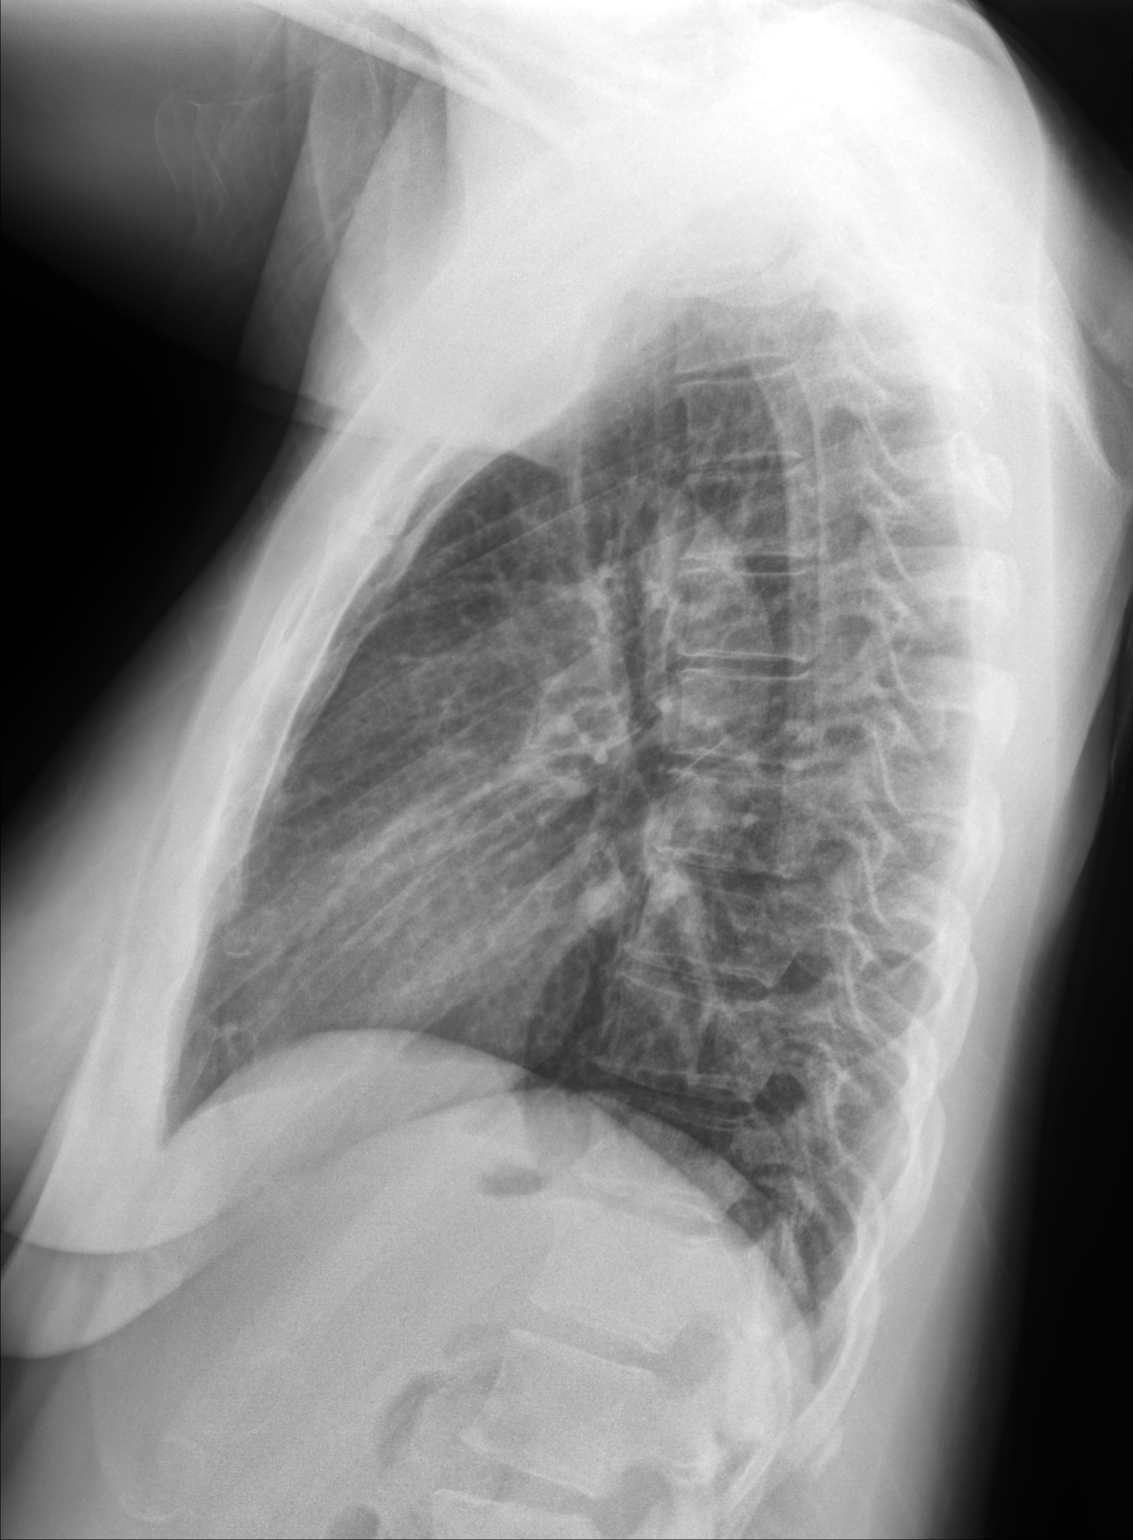

[2 of 2 positions shown; findings below may reference images not displayed]

FINDINGS: Lungs are adequately inflated and otherwise clear. Cardiomediastinal
silhouette and remainder the exam is unchanged.
IMPRESSION: No active cardiopulmonary disease.

## 2021-03-11 ENCOUNTER — Other Ambulatory Visit: Payer: Self-pay

## 2021-03-12 ENCOUNTER — Encounter: Payer: Self-pay | Admitting: Nurse Practitioner

## 2021-03-12 ENCOUNTER — Ambulatory Visit (INDEPENDENT_AMBULATORY_CARE_PROVIDER_SITE_OTHER): Payer: 59 | Admitting: Nurse Practitioner

## 2021-03-12 ENCOUNTER — Other Ambulatory Visit (HOSPITAL_COMMUNITY)
Admission: RE | Admit: 2021-03-12 | Discharge: 2021-03-12 | Disposition: A | Discharge status: Home / Self Care | Payer: 59 | Source: Ambulatory Visit | Attending: Nurse Practitioner | Admitting: Nurse Practitioner

## 2021-03-12 ENCOUNTER — Ambulatory Visit (INDEPENDENT_AMBULATORY_CARE_PROVIDER_SITE_OTHER): Payer: 59

## 2021-03-12 VITALS — BP 100/60 | HR 77 | Temp 97.4°F | Ht 64.0 in | Wt 165.2 lb

## 2021-03-12 DIAGNOSIS — Z124 Encounter for screening for malignant neoplasm of cervix: Secondary | ICD-10-CM

## 2021-03-12 DIAGNOSIS — Z136 Encounter for screening for cardiovascular disorders: Secondary | ICD-10-CM

## 2021-03-12 DIAGNOSIS — Z111 Encounter for screening for respiratory tuberculosis: Secondary | ICD-10-CM

## 2021-03-12 DIAGNOSIS — H65192 Other acute nonsuppurative otitis media, left ear: Secondary | ICD-10-CM

## 2021-03-12 DIAGNOSIS — Z1231 Encounter for screening mammogram for malignant neoplasm of breast: Secondary | ICD-10-CM | POA: Diagnosis not present

## 2021-03-12 DIAGNOSIS — Z0001 Encounter for general adult medical examination with abnormal findings: Secondary | ICD-10-CM

## 2021-03-12 DIAGNOSIS — Z13220 Encounter for screening for lipoid disorders: Secondary | ICD-10-CM

## 2021-03-12 LAB — COMPREHENSIVE METABOLIC PANEL
ALT: 9 U/L (ref 0–35)
AST: 10 U/L (ref 0–37)
Albumin: 3.9 g/dL (ref 3.5–5.2)
Alkaline Phosphatase: 34 U/L — ABNORMAL LOW (ref 39–117)
BUN: 9 mg/dL (ref 6–23)
CO2: 26 mEq/L (ref 19–32)
Calcium: 8.9 mg/dL (ref 8.4–10.5)
Chloride: 105 mEq/L (ref 96–112)
Creatinine, Ser: 0.72 mg/dL (ref 0.40–1.20)
GFR: 101.75 mL/min (ref >60.00)
Glucose, Bld: 100 mg/dL — ABNORMAL HIGH (ref 70–99)
Potassium: 3.9 mEq/L (ref 3.5–5.1)
Sodium: 138 mEq/L (ref 135–145)
Total Bilirubin: 0.6 mg/dL (ref 0.2–1.2)
Total Protein: 6.8 g/dL (ref 6.0–8.3)

## 2021-03-12 LAB — CBC
HCT: 38 % (ref 36.0–46.0)
Hemoglobin: 13 g/dL (ref 12.0–15.0)
MCHC: 34.2 g/dL (ref 30.0–36.0)
MCV: 93.3 fl (ref 78.0–100.0)
Platelets: 357 10*3/uL (ref 150.0–400.0)
RBC: 4.07 Mil/uL (ref 3.87–5.11)
RDW: 14.2 % (ref 11.5–15.5)
WBC: 5.1 10*3/uL (ref 4.0–10.5)

## 2021-03-12 LAB — LIPID PANEL
Cholesterol: 173 mg/dL (ref 0–200)
HDL: 89.8 mg/dL (ref >39.00)
LDL Cholesterol: 72 mg/dL (ref 0–99)
NonHDL: 83.15
Total CHOL/HDL Ratio: 2
Triglycerides: 57 mg/dL (ref 0.0–149.0)
VLDL: 11.4 mg/dL (ref 0.0–40.0)

## 2021-03-12 LAB — TSH: TSH: 1.65 u[IU]/mL (ref 0.35–4.50)

## 2021-03-12 NOTE — Progress Notes (Signed)
Subjective:    Patient ID: Tammy [name], female    DOB: 14-May-1976, 45 y.o.   MRN: [id number]  Patient presents today for CPE    HPI  Sexual History (orientation,birth control, marital status, STD):married, sexually active, use of COC to manage perimenopausal symptoms, needs repeat PAP and breast exam today. Denies need for STD screen  Depression/Suicide: Depression screen [facility] 2/9 03/12/2021 03/08/2020 03/02/2019  Decreased Interest 0 0 0  Down, Depressed, Hopeless 0 0 0  PHQ - 2 Score 0 0 0  Altered sleeping 0 - -  Tired, decreased energy 1 - -  Change in appetite 0 - -  Feeling bad or failure about yourself  0 - -  Trouble concentrating 0 - -  Moving slowly or fidgety/restless 0 - -  Suicidal thoughts 0 - -  PHQ-9 Score 1 - -  Difficult doing work/chores Not difficult at all - -   Vision:up to date  Dental:up to date  Immunizations: (TDAP, Hep C screen, Pneumovax, Influenza, zoster)  Health Maintenance  Topic Date Due  . Hepatitis C Screening: USPSTF Recommendation to screen - Ages 10-79 yo.  Never done  . COVID-19 Vaccine (3 - Booster for Pfizer series) 07/08/2020  . Flu Shot  05/27/2021  . Pap Smear  06/22/2021  . Tetanus Vaccine  03/01/2029  . HIV Screening  Completed  . HPV Vaccine  Aged Out   Diet:regular Exercise: elliptical and jump rope 2-3x/week Weight:  Wt Readings from Last 3 Encounters:  03/12/21 165 lb 3.2 oz (74.9 kg)  03/08/20 152 lb 9.6 oz (69.2 kg)  03/02/19 148 lb (67.1 kg)   Fall Risk: Fall Risk  03/12/2021 03/08/2020 03/02/2019  Falls in the past year? 0 0 0  Number falls in past yr: 0 0 0  Injury with Fall? 0 0 0  Risk for fall due to : No Fall Risks - -  Follow up Falls evaluation completed - -   Medications and allergies reviewed with patient and updated if appropriate.  Patient Active Problem List   Diagnosis Date Noted  . Encounter for surveillance of contraceptive pills 08/31/2020  . Preventative health care 03/02/2019  .  Alopecia 03/02/2019  . 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 norgestimate-ethinyl estradiol (ORTHO-CYCLEN) 0.25-35 MG-MCG tablet TAKE 1 TABLET BY MOUTH DAILY. MAINTAIN UPCOMING APPT FOR ADDITIONAL REFILLS 84 tablet 0  . Calcium-Magnesium-Vitamin D (CALCIUM 500 PO) Take by mouth. (Patient not taking: Reported on 03/12/2021)     No current facility-administered medications on file prior to visit.    Past Medical History:  Diagnosis Date  . History of chicken pox     Past Surgical History:  Procedure Laterality Date  . CESAREAN SECTION    . TOE SURGERY    . WISDOM TOOTH EXTRACTION      Social History   Socioeconomic History  . Marital status: Married    Spouse name: Not on file  . Number of children: Not on file  . Years of education: Not on file  . Highest education level: Not on file  Occupational History  . Not on file  Tobacco Use  . Smoking status: Never Smoker  . Smokeless tobacco: Never Used  Vaping Use  . Vaping Use: Never used  Substance and Sexual Activity  . Alcohol use: Not Currently  . Drug use: Not Currently  . Sexual activity: Not on file  Other Topics Concern  . Not on file  Social History Narrative  . Not on file   Social Determinants of Health   Financial Resource Strain: Not on file  Food Insecurity: Not on file  Transportation Needs: Not on file  Physical Activity: Not on file  Stress: Not on file  Social Connections: Not on file    Family History  Problem Relation Age of Onset  . Diabetes Mother   . Miscarriages / [place] Mother   . Mental illness Mother   . Cancer Father   . Early death Father   . Hypertension Maternal Grandmother   . Diabetes Maternal Grandfather         Review of Systems  Constitutional: Negative for fever, malaise/fatigue and weight loss.  HENT: Negative for congestion and sore throat.   Eyes:       Negative for  visual changes  Respiratory: Negative for cough and shortness of breath.   Cardiovascular: Negative for chest pain, palpitations and leg swelling.  Gastrointestinal: Negative for blood in stool, constipation, diarrhea and heartburn.  Genitourinary: Negative for dysuria, frequency and urgency.  Musculoskeletal: Negative for falls, joint pain and myalgias.  Skin: Negative for rash.  Neurological: Negative for dizziness, sensory change and headaches.  Endo/Heme/Allergies: Does not bruise/bleed easily.  Psychiatric/Behavioral: Negative for depression, substance abuse and suicidal ideas. The patient is not nervous/anxious.    Objective:   Vitals:   03/12/21 0811  BP: 100/60  Pulse: 77  Temp: (!) 97.4 F (36.3 C)  SpO2: 98%   Body mass index is 28.36 kg/m.  Physical Examination:  Physical Exam Vitals reviewed. Exam conducted with a chaperone present.  Constitutional:      General: She is not in acute distress.    Appearance: She is well-developed.  HENT:     Right Ear: Hearing, tympanic membrane, ear canal and external ear normal.     Left Ear: Hearing, ear canal and external ear normal. No tenderness. A middle ear effusion is present. There is no impacted cerumen. No mastoid tenderness. Tympanic membrane is not injected, perforated, erythematous or retracted.  Eyes:     Extraocular Movements: Extraocular movements intact.     Conjunctiva/sclera: Conjunctivae normal.  Cardiovascular:     Rate and Rhythm: Normal rate and regular rhythm.     Pulses: Normal pulses.     Heart sounds: Normal heart sounds.  Pulmonary:     Effort: Pulmonary effort is normal. No respiratory distress.     Breath sounds: Normal breath sounds.  Chest:     Chest wall: No tenderness.  Breasts: Breasts are symmetrical.     Right: Normal. No axillary adenopathy or supraclavicular adenopathy.     Left: Normal. No axillary adenopathy or supraclavicular adenopathy.    Abdominal:     General: Bowel sounds  are normal.     Palpations: Abdomen is soft.  Genitourinary:    General: Normal vulva.     Labia:        Right: No rash or tenderness.        Left: No rash or tenderness.      Vagina: Vaginal discharge present. No erythema or tenderness.     Cervix: Normal.     Uterus: Normal.      Adnexa: Right adnexa normal and left adnexa normal.  Musculoskeletal:        General: Normal range of motion.     Cervical back: Normal range of motion and neck supple.     Right  lower leg: No edema.     Left lower leg: No edema.  Lymphadenopathy:     Cervical: No cervical adenopathy.     Upper Body:     Right upper body: No supraclavicular, axillary or pectoral adenopathy.     Left upper body: No supraclavicular, axillary or pectoral adenopathy.     Lower Body: No right inguinal adenopathy. No left inguinal adenopathy.  Skin:    General: Skin is warm and dry.  Neurological:     Mental Status: She is alert and oriented to person, place, and time.     Deep Tendon Reflexes: Reflexes are normal and symmetric.  Psychiatric:        Mood and Affect: Mood normal.        Behavior: Behavior normal.        Thought Content: Thought content normal.    ASSESSMENT and PLAN: This visit occurred during the SARS-CoV-2 public health emergency.  Safety protocols were in place, including screening questions prior to the visit, additional usage of staff PPE, and extensive cleaning of exam room while observing appropriate contact time as indicated for disinfecting solutions.   Glenn was seen today for annual exam.  Diagnoses and all orders for this visit:  Encounter for preventative adult health care exam with abnormal findings -     Lipid panel -     CBC -     Comprehensive metabolic panel -     TSH  Breast cancer screening by mammogram -     MM 3D SCREEN BREAST BILATERAL; Future  Encounter for Papanicolaou smear for cervical cancer screening -     Cytology - PAP( [place])  Encounter for lipid  screening for cardiovascular disease -     Lipid panel  Acute effusion of left ear  Screening-pulmonary TB -     DG Chest 2 View      Problem List Items Addressed This Visit   None   Visit Diagnoses    Encounter for preventative adult health care exam with abnormal findings    -  Primary   Relevant Orders   Lipid panel   CBC   Comprehensive metabolic panel   TSH   Breast cancer screening by mammogram       Relevant Orders   MM 3D SCREEN BREAST BILATERAL   Encounter for Papanicolaou smear for cervical cancer screening       Relevant Orders   Cytology - PAP( [place])   Encounter for lipid screening for cardiovascular disease       Relevant Orders   Lipid panel   Acute effusion of left ear       Screening-pulmonary TB       Relevant Orders   DG Chest 2 View      Follow up: Return in about 1 year (around 03/12/2022) for CPE (fasting).  Alysia Penna, NP

## 2021-03-12 NOTE — Patient Instructions (Addendum)
Use claritin D or zyrtec D 1tab daily x 5days then stop to help with left ear effusion.  Go to lab for blood draw and CXR.  You will be contacetd to schedule appt for mammogram.  Preventive Care 31-44 Years Old, Female Preventive care refers to lifestyle choices and visits with your health care provider that can promote health and wellness. This includes:  A yearly physical exam. This is also called an annual wellness visit.  Regular dental and eye exams.  Immunizations.  Screening for certain conditions.  Healthy lifestyle choices, such as: ? Eating a healthy diet. ? Getting regular exercise. ? Not using drugs or products that contain nicotine and tobacco. ? Limiting alcohol use. What can I expect for my preventive care visit? Physical exam Your health care provider will check your:  Height and weight. These may be used to calculate your BMI (body mass index). BMI is a measurement that tells if you are at a healthy weight.  Heart rate and blood pressure.  Body temperature.  Skin for abnormal spots. Counseling Your health care provider may ask you questions about your:  Past medical problems.  Family's medical history.  Alcohol, tobacco, and drug use.  Emotional well-being.  Home life and relationship well-being.  Sexual activity.  Diet, exercise, and sleep habits.  Work and work Statistician.  Access to firearms.  Method of birth control.  Menstrual cycle.  Pregnancy history. What immunizations do I need? Vaccines are usually given at various ages, according to a schedule. Your health care provider will recommend vaccines for you based on your age, medical history, and lifestyle or other factors, such as travel or where you work.   What tests do I need? Blood tests  Lipid and cholesterol levels. These may be checked every 5 years, or more often if you are over 82 years old.  Hepatitis C test.  Hepatitis B test. Screening  Lung cancer screening.  You may have this screening every year starting at age 58 if you have a 30-pack-year history of smoking and currently smoke or have quit within the past 15 years.  Colorectal cancer screening. ? All adults should have this screening starting at age 73 and continuing until age 50. ? Your health care provider may recommend screening at age 45 if you are at increased risk. ? You will have tests every 1-10 years, depending on your results and the type of screening test.  Diabetes screening. ? This is done by checking your blood sugar (glucose) after you have not eaten for a while (fasting). ? You may have this done every 1-3 years.  Mammogram. ? This may be done every 1-2 years. ? Talk with your health care provider about when you should start having regular mammograms. This may depend on whether you have a family history of breast cancer.  BRCA-related cancer screening. This may be done if you have a family history of breast, ovarian, tubal, or peritoneal cancers.  Pelvic exam and Pap test. ? This may be done every 3 years starting at age 62. ? Starting at age 12, this may be done every 5 years if you have a Pap test in combination with an HPV test. Other tests  STD (sexually transmitted disease) testing, if you are at risk.  Bone density scan. This is done to screen for osteoporosis. You may have this scan if you are at high risk for osteoporosis. Talk with your health care provider about your test results, treatment options, and if  necessary, the need for more tests. Follow these instructions at home: Eating and drinking  Eat a diet that includes fresh fruits and vegetables, whole grains, lean protein, and low-fat dairy products.  Take vitamin and mineral supplements as recommended by your health care provider.  Do not drink alcohol if: ? Your health care provider tells you not to drink. ? You are pregnant, may be pregnant, or are planning to become pregnant.  If you drink  alcohol: ? Limit how much you have to 0-1 drink a day. ? Be aware of how much alcohol is in your drink. In the [place] one drink equals one 12 oz bottle of beer (355 mL), one 5 oz glass of wine (148 mL), or one 1 oz glass of hard liquor (44 mL).   Lifestyle  Take daily care of your teeth and gums. Brush your teeth every morning and night with fluoride toothpaste. Floss one time each day.  Stay active. Exercise for at least 30 minutes 5 or more days each week.  Do not use any products that contain nicotine or tobacco, such as cigarettes, e-cigarettes, and chewing tobacco. If you need help quitting, ask your health care provider.  Do not use drugs.  If you are sexually active, practice safe sex. Use a condom or other form of protection to prevent STIs (sexually transmitted infections).  If you do not wish to become pregnant, use a form of birth control. If you plan to become pregnant, see your health care provider for a prepregnancy visit.  If told by your health care provider, take low-dose aspirin daily starting at age 20.  Find healthy ways to cope with stress, such as: ? Meditation, yoga, or listening to music. ? Journaling. ? Talking to a trusted person. ? Spending time with friends and family. Safety  Always wear your seat belt while driving or riding in a vehicle.  Do not drive: ? If you have been drinking alcohol. Do not ride with someone who has been drinking. ? When you are tired or distracted. ? While texting.  Wear a helmet and other protective equipment during sports activities.  If you have firearms in your house, make sure you follow all gun safety procedures. What's next?  Visit your health care provider once a year for an annual wellness visit.  Ask your health care provider how often you should have your eyes and teeth checked.  Stay up to date on all vaccines. This information is not intended to replace advice given to you by your health care provider. Make  sure you discuss any questions you have with your health care provider. Document Revised: 07/17/2020 Document Reviewed: 06/24/2018 Elsevier Patient Education  2021 [employer].

## 2021-03-14 ENCOUNTER — Telehealth: Payer: Self-pay | Admitting: Nurse Practitioner

## 2021-03-14 NOTE — Telephone Encounter (Signed)
Patient dropped off form to be completed by provider. Placed in providers folder for pick up. Please call patient when ready. She also asked that the "letter" be attached to the form.

## 2021-03-15 LAB — CYTOLOGY - PAP
Adequacy: ABSENT
Comment: NEGATIVE
Diagnosis: NEGATIVE
High risk HPV: NEGATIVE

## 2021-03-18 ENCOUNTER — Encounter: Payer: Self-pay | Admitting: Nurse Practitioner

## 2021-04-27 ENCOUNTER — Other Ambulatory Visit: Payer: Self-pay | Admitting: Nurse Practitioner

## 2021-04-27 DIAGNOSIS — Z3041 Encounter for surveillance of contraceptive pills: Secondary | ICD-10-CM

## 2021-04-30 NOTE — Telephone Encounter (Signed)
Chart supports Rx 

## 2021-05-20 ENCOUNTER — Ambulatory Visit
Admission: RE | Admit: 2021-05-20 | Discharge: 2021-05-20 | Disposition: A | Discharge status: Home / Self Care | Payer: 59 | Source: Ambulatory Visit | Attending: Nurse Practitioner | Admitting: Nurse Practitioner

## 2021-05-20 ENCOUNTER — Other Ambulatory Visit: Payer: Self-pay

## 2021-05-20 DIAGNOSIS — Z1231 Encounter for screening mammogram for malignant neoplasm of breast: Secondary | ICD-10-CM

## 2021-05-22 ENCOUNTER — Other Ambulatory Visit: Payer: Self-pay | Admitting: Nurse Practitioner

## 2021-05-22 DIAGNOSIS — R928 Other abnormal and inconclusive findings on diagnostic imaging of breast: Secondary | ICD-10-CM

## 2021-06-07 ENCOUNTER — Other Ambulatory Visit: Payer: Self-pay | Admitting: Nurse Practitioner

## 2021-06-07 ENCOUNTER — Other Ambulatory Visit: Payer: Self-pay

## 2021-06-07 ENCOUNTER — Ambulatory Visit
Admission: RE | Admit: 2021-06-07 | Discharge: 2021-06-07 | Disposition: A | Discharge status: Home / Self Care | Payer: 59 | Source: Ambulatory Visit | Attending: Nurse Practitioner | Admitting: Nurse Practitioner

## 2021-06-07 DIAGNOSIS — R928 Other abnormal and inconclusive findings on diagnostic imaging of breast: Secondary | ICD-10-CM

## 2021-06-07 DIAGNOSIS — N63 Unspecified lump in unspecified breast: Secondary | ICD-10-CM

## 2021-07-17 ENCOUNTER — Other Ambulatory Visit: Payer: Self-pay

## 2021-07-17 DIAGNOSIS — Z3041 Encounter for surveillance of contraceptive pills: Secondary | ICD-10-CM

## 2021-07-17 MED ORDER — NORGESTIMATE-ETH ESTRADIOL 0.25-35 MG-MCG PO TABS: 1.0000 | ORAL_TABLET | Freq: Every day | ORAL | 3 refills | Status: DC

## 2021-07-17 NOTE — Telephone Encounter (Signed)
LR  04/30/21, #84, 0  LOV 03/12/21 FOV 03/14/22  Rx sent to pharmacy. Dm/cma

## 2021-12-16 ENCOUNTER — Other Ambulatory Visit: Payer: 59

## 2022-01-07 ENCOUNTER — Ambulatory Visit
Admission: RE | Admit: 2022-01-07 | Discharge: 2022-01-07 | Disposition: A | Discharge status: Home / Self Care | Payer: Self-pay | Source: Ambulatory Visit | Attending: Nurse Practitioner | Admitting: Nurse Practitioner

## 2022-01-07 ENCOUNTER — Other Ambulatory Visit: Payer: Self-pay | Admitting: Nurse Practitioner

## 2022-01-07 ENCOUNTER — Ambulatory Visit
Admission: RE | Admit: 2022-01-07 | Discharge: 2022-01-07 | Disposition: A | Discharge status: Home / Self Care | Payer: Managed Care, Other (non HMO) | Source: Ambulatory Visit | Attending: Nurse Practitioner | Admitting: Nurse Practitioner

## 2022-01-07 DIAGNOSIS — N63 Unspecified lump in unspecified breast: Secondary | ICD-10-CM

## 2022-03-14 ENCOUNTER — Ambulatory Visit (INDEPENDENT_AMBULATORY_CARE_PROVIDER_SITE_OTHER): Payer: Commercial Managed Care - HMO | Admitting: Nurse Practitioner

## 2022-03-14 ENCOUNTER — Encounter: Payer: Self-pay | Admitting: Nurse Practitioner

## 2022-03-14 VITALS — BP 110/60 | HR 60 | Temp 97.3°F | Ht 64.0 in | Wt 153.0 lb

## 2022-03-14 DIAGNOSIS — Z0000 Encounter for general adult medical examination without abnormal findings: Secondary | ICD-10-CM

## 2022-03-14 LAB — COMPREHENSIVE METABOLIC PANEL
ALT: 12 U/L (ref 0–35)
AST: 14 U/L (ref 0–37)
Albumin: 4.1 g/dL (ref 3.5–5.2)
Alkaline Phosphatase: 36 U/L — ABNORMAL LOW (ref 39–117)
BUN: 9 mg/dL (ref 6–23)
CO2: 26 mEq/L (ref 19–32)
Calcium: 9.4 mg/dL (ref 8.4–10.5)
Chloride: 104 mEq/L (ref 96–112)
Creatinine, Ser: 0.76 mg/dL (ref 0.40–1.20)
GFR: 94.69 mL/min (ref >60.00)
Glucose, Bld: 98 mg/dL (ref 70–99)
Potassium: 4.8 mEq/L (ref 3.5–5.1)
Sodium: 137 mEq/L (ref 135–145)
Total Bilirubin: 0.4 mg/dL (ref 0.2–1.2)
Total Protein: 7.5 g/dL (ref 6.0–8.3)

## 2022-03-14 NOTE — Progress Notes (Signed)
Complete physical exam  Patient: Tammy [name]   DOB: [birth year]   46 y.o. Female  MRN: [id number] Visit Date: 03/14/2022  Subjective:    Chief Complaint  Patient presents with   Annual Exam    CPE Pt fasting No other concerns    Tammy [name] is a 46 y.o. female who presents today for a complete physical exam. She reports consuming a general diet.  Walking and elliptical 20-20mins, weight training  She generally feels well. She reports sleeping well. She does not have additional problems to discuss today.  Vision:Yes Dental:Yes STD Screen:Yes  Wt Readings from Last 3 Encounters:  03/14/22 153 lb (69.4 kg)  03/12/21 165 lb 3.2 oz (74.9 kg)  03/08/20 152 lb 9.6 oz (69.2 kg)    Most recent fall risk assessment:    03/14/2022    8:27 AM  Fall Risk   Falls in the past year? 0  Number falls in past yr: 0  Injury with Fall? 0     Most recent depression screenings:    03/14/2022    9:03 AM 03/12/2021   12:28 PM  PHQ 2/9 Scores  PHQ - 2 Score 1 0  PHQ- 9 Score 3 1    HPI  No problem-specific Assessment & Plan notes found for this encounter.   Past Medical History:  Diagnosis Date   History of chicken pox    Past Surgical History:  Procedure Laterality Date   CESAREAN SECTION     TOE SURGERY     WISDOM TOOTH EXTRACTION     Social History   Socioeconomic History   Marital status: Married    Spouse name: Not on file   Number of children: 3   Years of education: 4   Highest education level: Not on file  Occupational History   Not on file  Tobacco Use   Smoking status: Never   Smokeless tobacco: Never  Vaping Use   Vaping Use: Never used  Substance and Sexual Activity   Alcohol use: Not Currently   Drug use: Not Currently   Sexual activity: Yes    Birth control/protection: Pill  Other Topics Concern   Not on file  Social History Narrative   Not on file   Social Determinants of Health   Financial Resource Strain: Not on file  Food  Insecurity: Not on file  Transportation Needs: Not on file  Physical Activity: Not on file  Stress: Not on file  Social Connections: Not on file  Intimate Partner Violence: Not on file   Family Status  Relation Name Status   Mother  Alive   Father  Deceased   MGM  Deceased   MGF  Deceased   PGM  Deceased   PGF  Deceased   Family History  Problem Relation Age of Onset   Diabetes Mother    Miscarriages / [place] Mother    Mental illness Mother    Glaucoma Mother    Cancer Father    Early death Father    Hypertension Maternal Grandmother    Diabetes Maternal Grandfather    No Known Allergies  Patient Care Team: Siriyah Ambrosius, Bonna Gains, NP as PCP - General (Internal Medicine)   Medications: Outpatient Medications Prior to Visit  Medication Sig   Calcium-Magnesium-Vitamin D (CALCIUM 500 PO) Take by mouth.   norgestimate-ethinyl estradiol (ORTHO-CYCLEN) 0.25-35 MG-MCG tablet Take 1 tablet by mouth daily. Maintain upcoming appt for additional refills   UNABLE TO FIND Take 4 tablets by  mouth daily. Nutrafol   No facility-administered medications prior to visit.    Review of Systems  Constitutional:  Negative for fever.  HENT:  Negative for congestion and sore throat.   Eyes:        Negative for visual changes  Respiratory:  Negative for cough and shortness of breath.   Cardiovascular:  Negative for chest pain, palpitations and leg swelling.  Gastrointestinal:  Negative for blood in stool, constipation and diarrhea.  Genitourinary:  Negative for dysuria, frequency and urgency.  Musculoskeletal:  Negative for myalgias.  Skin:  Negative for rash.  Neurological:  Negative for dizziness and headaches.  Hematological:  Does not bruise/bleed easily.  Psychiatric/Behavioral:  Negative for suicidal ideas. The patient is not nervous/anxious.        Objective:  BP 110/60 (BP Location: Right Arm, Patient Position: Sitting, Cuff Size: Small)   Pulse 60   Temp (!) 97.3 F (36.3  C) (Temporal)   Ht 5\' 4"  (1.626 m)   Wt 153 lb (69.4 kg)   SpO2 98%   BMI 26.26 kg/m       Physical Exam Constitutional:      General: She is not in acute distress. HENT:     Right Ear: Tympanic membrane, ear canal and external ear normal.     Left Ear: Tympanic membrane, ear canal and external ear normal.     Nose: Nose normal.  Eyes:     General: No scleral icterus.    Extraocular Movements: Extraocular movements intact.     Conjunctiva/sclera: Conjunctivae normal.  Cardiovascular:     Rate and Rhythm: Normal rate and regular rhythm.     Pulses: Normal pulses.     Heart sounds: Normal heart sounds.  Pulmonary:     Effort: Pulmonary effort is normal. No respiratory distress.     Breath sounds: Normal breath sounds.  Abdominal:     General: Bowel sounds are normal. There is no distension.     Palpations: Abdomen is soft.  Musculoskeletal:        General: Normal range of motion.     Cervical back: Normal range of motion and neck supple.     Right lower leg: No edema.     Left lower leg: No edema.  Lymphadenopathy:     Cervical: No cervical adenopathy.  Skin:    General: Skin is warm and dry.  Neurological:     Mental Status: She is alert and oriented to person, place, and time.  Psychiatric:        Behavior: Behavior normal.     No results found for any visits on 03/14/22.    Assessment & Plan:    Routine Health Maintenance and Physical Exam  Immunization History  Administered Date(s) Administered   Influenza Whole 09/27/2009   Influenza-Unspecified 07/16/2019, 07/30/2021   Meningococcal Mcv4o 05/10/2016   PFIZER(Purple Top)SARS-COV-2 Vaccination 01/12/2020, 02/06/2020   Td 10/09/2004   Tdap 03/02/2019    Health Maintenance  Topic Date Due   Hepatitis C Screening  Never done   COLONOSCOPY (Pts [phone] Insurance coverage will need to be confirmed)  Never done   COVID-19 Vaccine (3 - Booster for [employer] series) 03/30/2022 (Originally 04/02/2020)    INFLUENZA VACCINE  05/27/2022   PAP SMEAR-Modifier  03/12/2024   TETANUS/TDAP  03/01/2029   HIV Screening  Completed   HPV VACCINES  Aged Out   Discussed health benefits of physical activity, and encouraged her to engage in regular exercise appropriate for her age and  condition.  Problem List Items Addressed This Visit       Other   Preventative health care - Primary   Relevant Orders   Comprehensive metabolic panel   Return in about 1 year (around 03/15/2023) for CPE (fasting).     Alysia Penna, NP

## 2022-03-14 NOTE — Patient Instructions (Signed)
Go to lab  Preventive Care 40-45 Years Old, Female Preventive care refers to lifestyle choices and visits with your health care provider that can promote health and wellness. Preventive care visits are also called wellness exams. What can I expect for my preventive care visit? Counseling Your health care provider may ask you questions about your: Medical history, including: Past medical problems. Family medical history. Pregnancy history. Current health, including: Menstrual cycle. Method of birth control. Emotional well-being. Home life and relationship well-being. Sexual activity and sexual health. Lifestyle, including: Alcohol, nicotine or tobacco, and drug use. Access to firearms. Diet, exercise, and sleep habits. Work and work environment. Sunscreen use. Safety issues such as seatbelt and bike helmet use. Physical exam Your health care provider will check your: Height and weight. These may be used to calculate your BMI (body mass index). BMI is a measurement that tells if you are at a healthy weight. Waist circumference. This measures the distance around your waistline. This measurement also tells if you are at a healthy weight and may help predict your risk of certain diseases, such as type 2 diabetes and high blood pressure. Heart rate and blood pressure. Body temperature. Skin for abnormal spots. What immunizations do I need?  Vaccines are usually given at various ages, according to a schedule. Your health care provider will recommend vaccines for you based on your age, medical history, and lifestyle or other factors, such as travel or where you work. What tests do I need? Screening Your health care provider may recommend screening tests for certain conditions. This may include: Lipid and cholesterol levels. Diabetes screening. This is done by checking your blood sugar (glucose) after you have not eaten for a while (fasting). Pelvic exam and Pap test. Hepatitis B  test. Hepatitis C test. HIV (human immunodeficiency virus) test. STI (sexually transmitted infection) testing, if you are at risk. Lung cancer screening. Colorectal cancer screening. Mammogram. Talk with your health care provider about when you should start having regular mammograms. This may depend on whether you have a family history of breast cancer. BRCA-related cancer screening. This may be done if you have a family history of breast, ovarian, tubal, or peritoneal cancers. Bone density scan. This is done to screen for osteoporosis. Talk with your health care provider about your test results, treatment options, and if necessary, the need for more tests. Follow these instructions at home: Eating and drinking  Eat a diet that includes fresh fruits and vegetables, whole grains, lean protein, and low-fat dairy products. Take vitamin and mineral supplements as recommended by your health care provider. Do not drink alcohol if: Your health care provider tells you not to drink. You are pregnant, may be pregnant, or are planning to become pregnant. If you drink alcohol: Limit how much you have to 0-1 drink a day. Know how much alcohol is in your drink. In the [place] one drink equals one 12 oz bottle of beer (355 mL), one 5 oz glass of wine (148 mL), or one 1 oz glass of hard liquor (44 mL). Lifestyle Brush your teeth every morning and night with fluoride toothpaste. Floss one time each day. Exercise for at least 30 minutes 5 or more days each week. Do not use any products that contain nicotine or tobacco. These products include cigarettes, chewing tobacco, and vaping devices, such as e-cigarettes. If you need help quitting, ask your health care provider. Do not use drugs. If you are sexually active, practice safe sex. Use a condom or other   form of protection to prevent STIs. If you do not wish to become pregnant, use a form of birth control. If you plan to become pregnant, see your health care  provider for a prepregnancy visit. Take aspirin only as told by your health care provider. Make sure that you understand how much to take and what form to take. Work with your health care provider to find out whether it is safe and beneficial for you to take aspirin daily. Find healthy ways to manage stress, such as: Meditation, yoga, or listening to music. Journaling. Talking to a trusted person. Spending time with friends and family. Minimize exposure to UV radiation to reduce your risk of skin cancer. Safety Always wear your seat belt while driving or riding in a vehicle. Do not drive: If you have been drinking alcohol. Do not ride with someone who has been drinking. When you are tired or distracted. While texting. If you have been using any mind-altering substances or drugs. Wear a helmet and other protective equipment during sports activities. If you have firearms in your house, make sure you follow all gun safety procedures. Seek help if you have been physically or sexually abused. What's next? Visit your health care provider once a year for an annual wellness visit. Ask your health care provider how often you should have your eyes and teeth checked. Stay up to date on all vaccines. This information is not intended to replace advice given to you by your health care provider. Make sure you discuss any questions you have with your health care provider. Document Revised: 04/10/2021 Document Reviewed: 04/10/2021 Elsevier Patient Education  [place].

## 2022-05-30 ENCOUNTER — Telehealth: Payer: Self-pay | Admitting: Nurse Practitioner

## 2022-05-30 NOTE — Telephone Encounter (Signed)
Paperwork received, please allow 7-10 business days to complete

## 2022-05-30 NOTE — Telephone Encounter (Signed)
Caller Name: Sanaya Gwilliam  Call back phone #: [phone]  Reason for Call: Pt walked in and dropped off paperwork that she needs signed by Nche. Asked for a call once it is ready to pick up. I have put the paperwork in Nche's folder.

## 2022-06-03 ENCOUNTER — Other Ambulatory Visit: Payer: Self-pay

## 2022-06-03 DIAGNOSIS — Z0001 Encounter for general adult medical examination with abnormal findings: Secondary | ICD-10-CM

## 2022-06-04 ENCOUNTER — Encounter: Payer: Self-pay | Admitting: Nurse Practitioner

## 2022-06-04 ENCOUNTER — Other Ambulatory Visit: Payer: Commercial Managed Care - HMO

## 2022-06-09 ENCOUNTER — Telehealth: Payer: Self-pay | Admitting: Nurse Practitioner

## 2022-06-09 NOTE — Telephone Encounter (Signed)
Caller Name: Tammy [name] Call back phone #: [phone]  Reason for Call: Pt would like to obtain her chest x-Ray. Asked for a call back to speak about results and when she could pick up the images

## 2022-06-09 NOTE — Telephone Encounter (Signed)
Called & spoke w/ pt. X-ray results and disk ready for pick up in front office. Adv pt of our operation hours.

## 2022-06-19 ENCOUNTER — Other Ambulatory Visit: Payer: Self-pay | Admitting: Nurse Practitioner

## 2022-06-19 DIAGNOSIS — Z3041 Encounter for surveillance of contraceptive pills: Secondary | ICD-10-CM

## 2022-06-19 NOTE — Telephone Encounter (Signed)
Chart supports Rx Last OV: 02/2022 Next OV: 02/2023  

## 2022-07-17 ENCOUNTER — Ambulatory Visit
Admission: RE | Admit: 2022-07-17 | Discharge: 2022-07-17 | Disposition: A | Discharge status: Home / Self Care | Payer: 59 | Source: Ambulatory Visit | Attending: Nurse Practitioner | Admitting: Nurse Practitioner

## 2022-07-17 DIAGNOSIS — N63 Unspecified lump in unspecified breast: Secondary | ICD-10-CM

## 2022-12-10 ENCOUNTER — Encounter: Payer: Self-pay | Admitting: Nurse Practitioner

## 2022-12-10 ENCOUNTER — Ambulatory Visit: Payer: Commercial Managed Care - PPO | Admitting: Nurse Practitioner

## 2022-12-10 VITALS — BP 114/76 | HR 65 | Temp 97.9°F | Resp 16 | Ht 64.0 in | Wt 145.0 lb

## 2022-12-10 DIAGNOSIS — G4700 Insomnia, unspecified: Secondary | ICD-10-CM | POA: Diagnosis not present

## 2022-12-10 DIAGNOSIS — M654 Radial styloid tenosynovitis [de Quervain]: Secondary | ICD-10-CM

## 2022-12-10 NOTE — Patient Instructions (Addendum)
Use wrist brace with spicca splint in Am and Pm x 1week, then PM only till symptoms resolve. Use voltaren gel in Am and PM.  Try magnesium glycinate for fragmented sleep.  De Quervain's Tenosynovitis  De Quervain's tenosynovitis is a condition that causes inflammation of the tendon on the thumb side of the wrist. Tendons are cords of tissue that connect bones to muscles. The tendons in the hand pass through a tunnel called a sheath. A slippery layer of tissue (synovium) lets the tendons move smoothly in the sheath. With de Quervain's tenosynovitis, the sheath swells or thickens, causing friction and pain. The condition is also called de Quervain's disease and de Quervain's syndrome. It occurs most often in women who are 24-37 years old. What are the causes? The exact cause of this condition is not known. It may be associated with overuse of the hand and wrist. What increases the risk? You are more likely to develop this condition if you: Use your hands far more than normal, especially if you repeat certain movements that involve twisting your hand or using a tight grip. Are pregnant. Are a middle-aged woman. Have rheumatoid arthritis. Have diabetes. What are the signs or symptoms? The main symptom of this condition is pain on the thumb side of the wrist. The pain may get worse when you grasp something or turn your wrist. Other symptoms may include: Pain that extends up the forearm. Swelling of your wrist and hand. Trouble moving the thumb and wrist. A sensation of snapping in the wrist. A bump filled with fluid (cyst) in the area of the pain. How is this diagnosed? This condition may be diagnosed based on: Your symptoms and medical history. A physical exam. During the exam, your health care provider may do a simple test Wynn Maudlin test) that involves pulling your thumb and wrist to see if this causes pain. You may also need to have an X-ray or ultrasound. How is this  treated? Treatment for this condition may include: Avoiding any activity that causes pain and swelling. Taking medicines. Anti-inflammatory medicines and corticosteroid injections may be used to reduce inflammation and relieve pain. Wearing a splint. Having surgery. This may be needed if other treatments do not work. Once the pain and swelling have gone down, you may start: Physical therapy. This includes exercises to improve movement and strength in your wrist and thumb. Occupational therapy. This includes adjusting how you move your wrist. Follow these instructions at home: If you have a splint: Wear the splint as told by your health care provider. Remove it only as told by your health care provider. Loosen the splint if your fingers tingle, become numb, or turn cold and blue. Keep the splint clean. If the splint is not waterproof: Do not let it get wet. Cover it with a watertight covering when you take a bath or a shower. Managing pain, stiffness, and swelling  Avoid movements and activities that cause pain and swelling in the wrist area. If directed, put ice on the painful area. This may be helpful after doing activities that involve the sore wrist. To do this: Put ice in a plastic bag. Place a towel between your skin and the bag. Leave the ice on for 20 minutes, 2-3 times a day. Remove the ice if your skin turns bright red. This is very important. If you cannot feel pain, heat, or cold, you have a greater risk of damage to the area. Move your fingers often to reduce stiffness and swelling. Raise (  elevate) the injured area above the level of your heart while you are sitting or lying down. General instructions Return to your normal activities as told by your health care provider. Ask your health care provider what activities are safe for you. Take over-the-counter and prescription medicines only as told by your health care provider. Keep all follow-up visits. This is  important. Contact a health care provider if: Your pain medicine does not help. Your pain gets worse. You develop new symptoms. Summary De Quervain's tenosynovitis is a condition that causes inflammation of the tendon on the thumb side of the wrist. The condition occurs most often in women who are 21-59 years old. The exact cause of this condition is not known. It may be associated with overuse of the hand and wrist. Treatment starts with avoiding activity that causes pain or swelling in the wrist area. Other treatments may include wearing a splint and taking medicine. Sometimes, surgery is needed. This information is not intended to replace advice given to you by your health care provider. Make sure you discuss any questions you have with your health care provider. Document Revised: 01/25/2020 Document Reviewed: 01/25/2020 Elsevier Patient Education  [place].

## 2022-12-10 NOTE — Progress Notes (Signed)
Acute Office Visit  Subjective:    Patient ID: Tammy [name], female    DOB: 1976/06/17, 47 y.o.   MRN: [id number]  Chief Complaint  Patient presents with   Hand Pain    Pt c/o left thumb pain and soreness . Started in September.  Pain is worse when squeezing something.     Wrist Pain  The pain is present in the left wrist (and left thumb). This is a new problem. The current episode started more than 1 month ago. There has been no history of extremity trauma. The problem occurs intermittently. The problem has been waxing and waning. The quality of the pain is described as aching. The pain is moderate. Pertinent negatives include no fever, inability to bear weight, itching, joint locking, joint swelling, limited range of motion, numbness, stiffness or tingling. The symptoms are aggravated by activity. She has tried rest for the symptoms. The treatment provided significant relief. Family history does not include gout or rheumatoid arthritis. There is no history of diabetes, gout, osteoarthritis or rheumatoid arthritis.  Insomnia Primary symptoms: fragmented sleep, no sleep disturbance, no difficulty falling asleep, no somnolence, frequent awakening, no premature morning awakening, malaise/fatigue, no napping.   The current episode started more than one year. The onset quality is gradual. The problem occurs nightly. The problem is unchanged. The symptoms are aggravated by caffeine and work stress. How many beverages per day that contain caffeine: 0 - 1.  Types of beverages you drink: tea. Nothing relieves the symptoms. Past treatments include nothing. Typical bedtime:  10-11 P.M..  How long after going to bed to you fall asleep: less than 15 minutes.   Duration of naps:  Other (no naps).  PMH includes: associated symptoms present, no hypertension, no depression, family stress or anxiety, no restless leg syndrome, work related stressors, no chronic pain, no apnea.  Prior diagnostic workup includes:   No prior workup.  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  Outpatient Medications Prior to Visit  Medication Sig   Calcium-Magnesium-Vitamin D (CALCIUM 500 PO) Take by mouth.   norgestimate-ethinyl estradiol (ORTHO-CYCLEN) 0.25-35 MG-MCG tablet TAKE 1 TABLET BY MOUTH DAILY. MAINTAIN UPCOMING APPT FOR ADDITIONAL REFILLS   UNABLE TO FIND Take 4 tablets by mouth daily. Nutrafol (Patient not taking: Reported on 12/10/2022)   No facility-administered medications prior to visit.   Reviewed past medical and social history.  Review of Systems  Constitutional:  Positive for malaise/fatigue. Negative for fever.  Respiratory:  Negative for apnea.   Musculoskeletal:  Negative for gout and stiffness.  Skin:  Negative for itching.  Neurological:  Negative for tingling and numbness.  Psychiatric/Behavioral:  Negative for depression and sleep disturbance. The patient has insomnia.    Per HPI     Objective:    Physical Exam Vitals reviewed.  Cardiovascular:     Rate and Rhythm: Normal rate.     Pulses: Normal pulses.  Pulmonary:     Effort: Pulmonary effort is normal.  Musculoskeletal:        General: Tenderness present. No swelling, deformity or signs of injury.     Left elbow: Normal.     Left forearm: Normal.     Left wrist: Tenderness present. No swelling, deformity, effusion, lacerations, bony tenderness, snuff box tenderness or crepitus. Normal range of motion. Normal pulse.  Left hand: Tenderness present. No bony tenderness. Normal range of motion. Normal strength. Normal sensation. Normal capillary refill. Normal pulse.       Hands:  Neurological:     Mental Status: She is alert and oriented to person, place, and time.     BP 114/76 (BP Location: Left Arm, Patient Position: Sitting, Cuff Size: Normal)   Pulse 65   Temp  97.9 F (36.6 C) (Temporal)   Resp 16   Ht 5' 4"$  (1.626 m)   Wt 145 lb (65.8 kg)   LMP 09/08/2022 (Approximate)   SpO2 99%   BMI 24.89 kg/m    No results found for any visits on 12/10/22.     Assessment & Plan:   Problem List Items Addressed This Visit   None Visit Diagnoses     De Quervain's tenosynovitis, left    -  Primary   Insomnia, unspecified type         Use wrist brace with spicca splint in Am and Pm x 1week, then PM only till symptoms resolve. Use voltaren gel in Am and PM. Try magnesium glycinate for fragmented sleep.  No orders of the defined types were placed in this encounter.  Return if symptoms worsen or fail to improve.    Wilfred Lacy, NP

## 2023-01-03 IMAGING — MG DIGITAL DIAGNOSTIC BILAT W/ TOMO W/ CAD
8 series · 8 of 24 positions shown · non-contrast
Comparison: Previous exam(s).

CLINICAL DATA: 46-year-old female presenting for follow-up of
bilateral breast asymmetries.

EXAM:
DIGITAL DIAGNOSTIC BILATERAL MAMMOGRAM WITH TOMOSYNTHESIS AND CAD
TECHNIQUE: Bilateral digital diagnostic mammography and breast tomosynthesis
was performed. The images were evaluated with computer-aided
detection.

[L CC synth-2D]
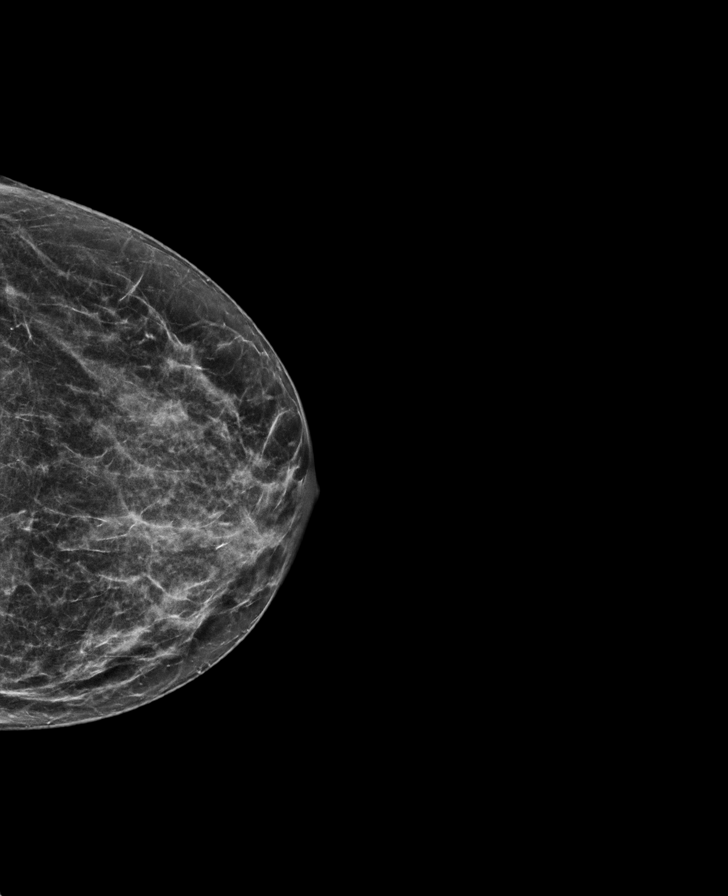

[R CC synth-2D]
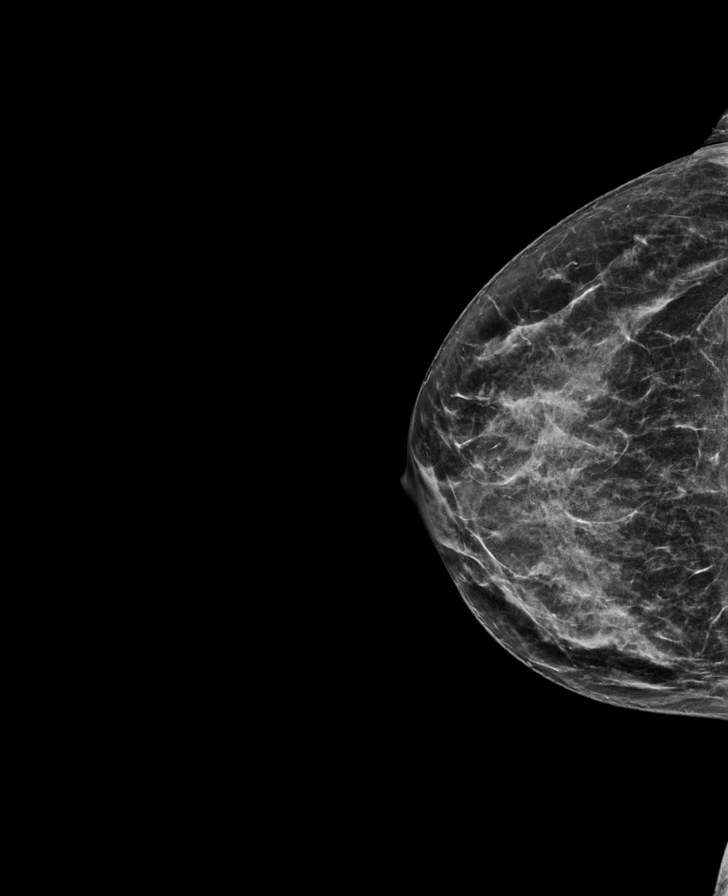

[L MLO synth-2D]
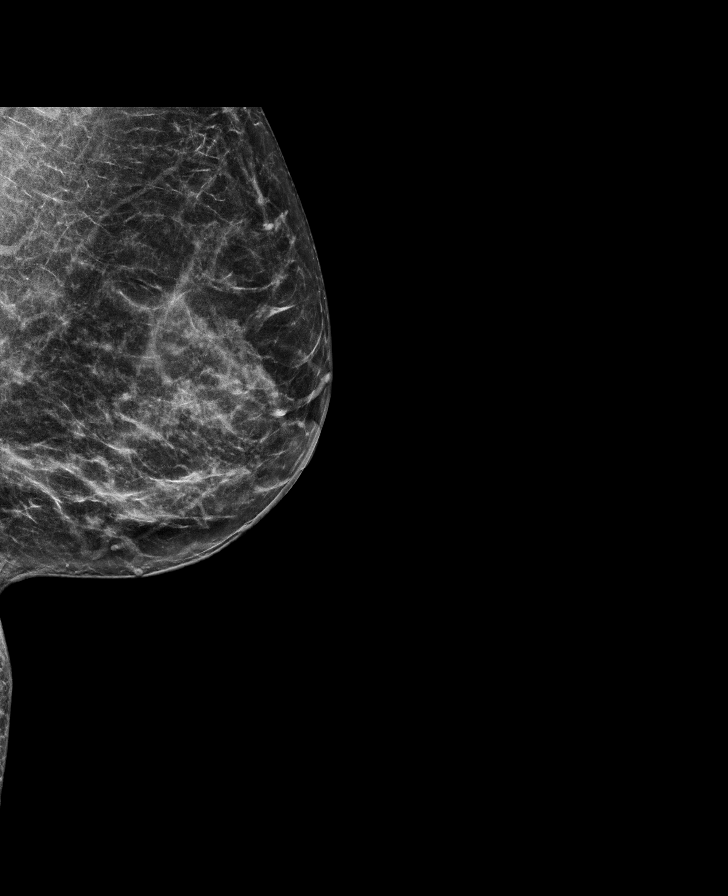

[R MLO synth-2D]
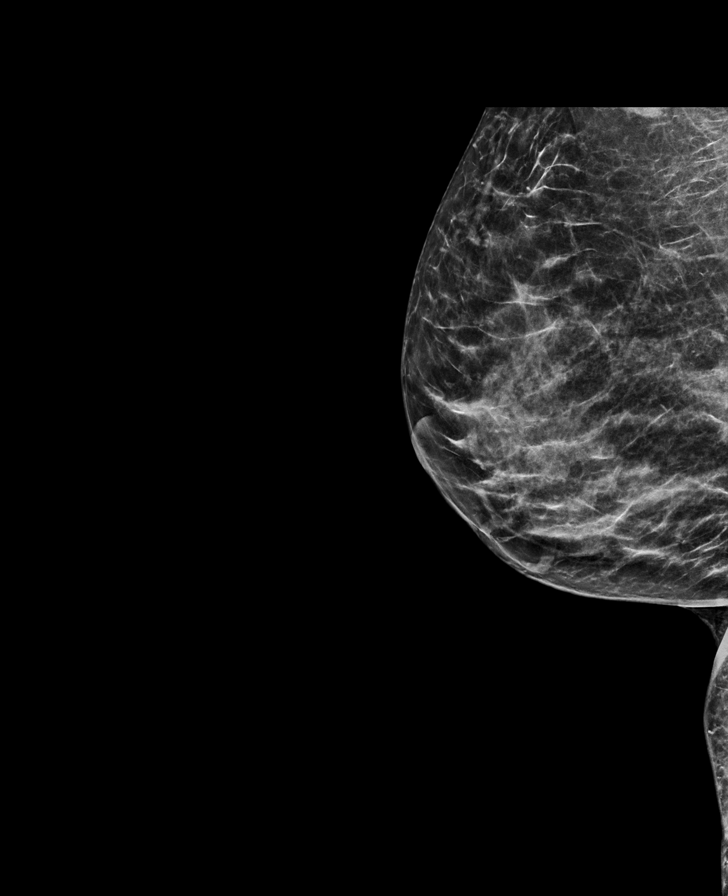

[L CC tomo · tomo slice 29/56.0]
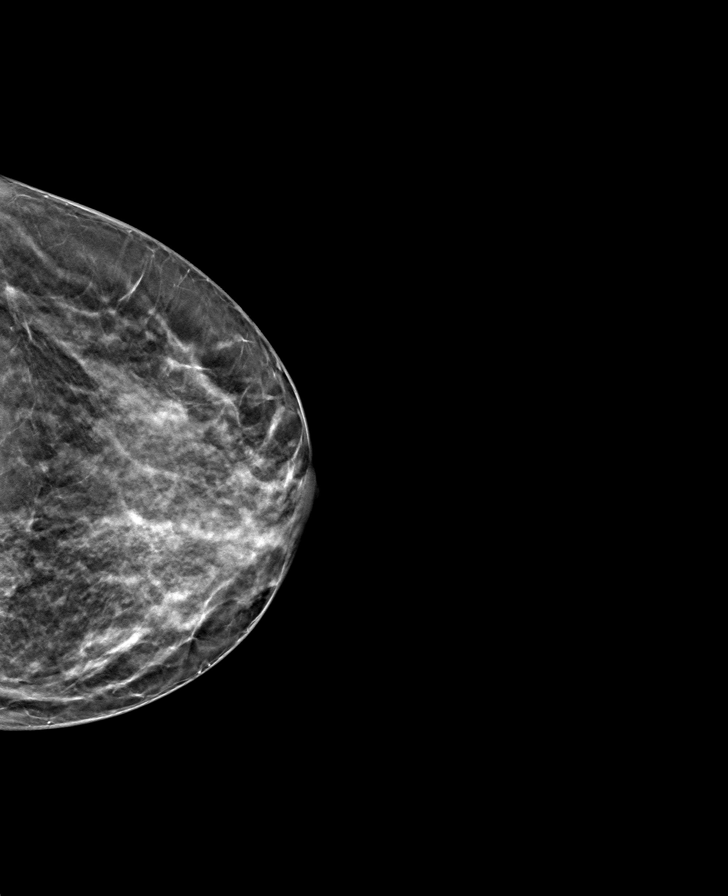

[R MLO tomo · tomo slice 28/55.0]
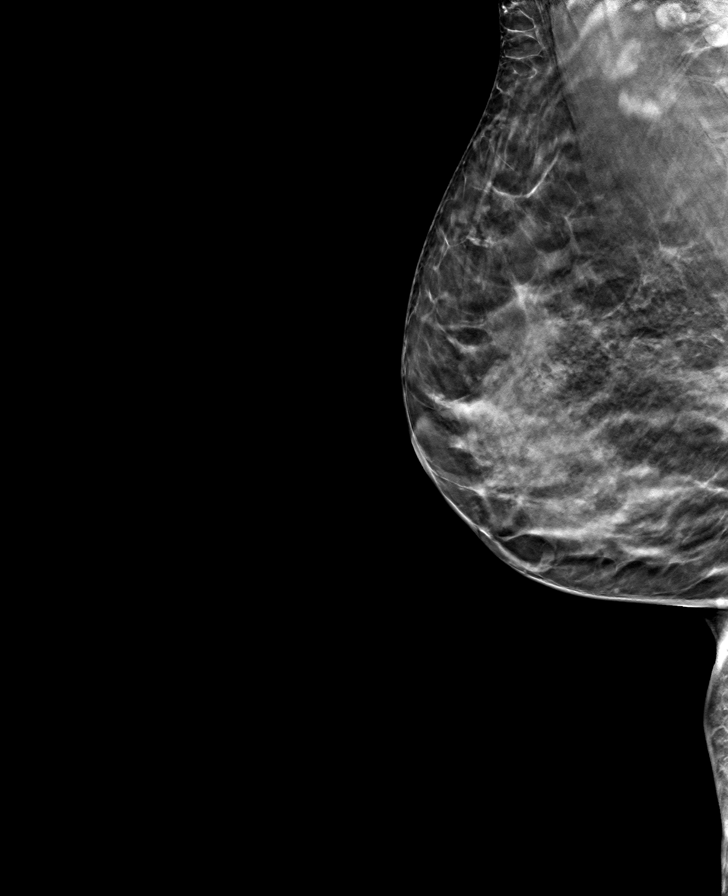

[L MLO tomo · tomo slice 31/60.0]
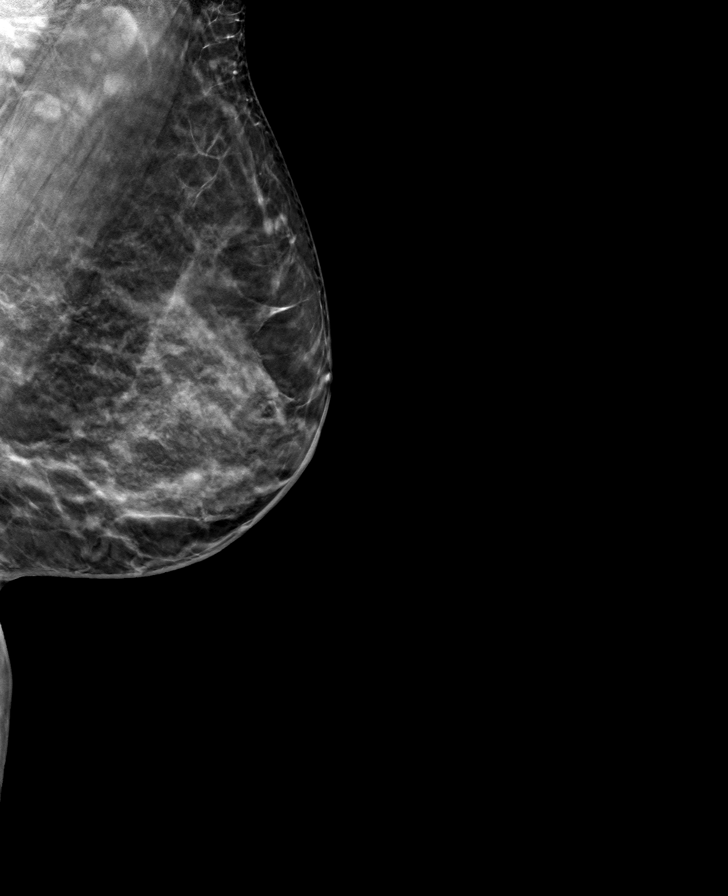

[R CC tomo · tomo slice 27/53.0]
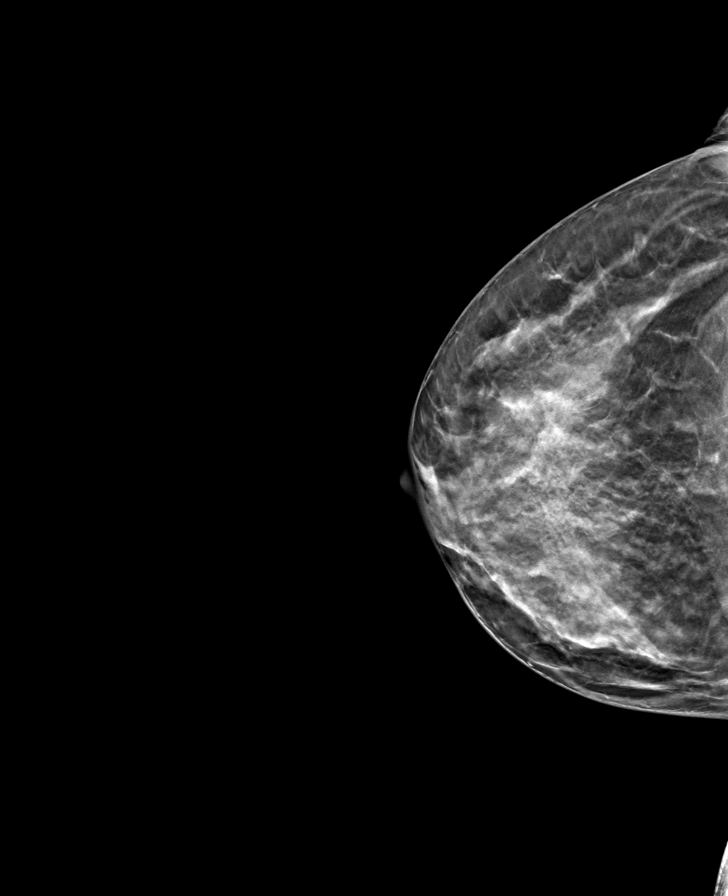

[8 of 24 positions shown; findings below may reference images not displayed]

ACR Breast Density Category c: The breast tissue is heterogeneously
dense, which may obscure small masses.
FINDINGS: The asymmetries in the central far posterior bilateral breasts on
the MLO view are stable. No suspicious calcifications, masses or
areas of distortion are seen in the bilateral breasts.
IMPRESSION: 1.  Stable bilateral breast asymmetries.

2.  No mammographic evidence of malignancy in the bilateral breasts.

RECOMMENDATION:
Six-month follow-up bilateral diagnostic mammogram.

I have discussed the findings and recommendations with the patient.
If applicable, a reminder letter will be sent to the patient
regarding the next appointment.

BI-RADS CATEGORY  3: Probably benign.

## 2023-03-09 DIAGNOSIS — H0288A Meibomian gland dysfunction right eye, upper and lower eyelids: Secondary | ICD-10-CM | POA: Diagnosis not present

## 2023-03-09 DIAGNOSIS — H40023 Open angle with borderline findings, high risk, bilateral: Secondary | ICD-10-CM | POA: Diagnosis not present

## 2023-03-09 DIAGNOSIS — H04123 Dry eye syndrome of bilateral lacrimal glands: Secondary | ICD-10-CM | POA: Diagnosis not present

## 2023-03-09 DIAGNOSIS — H0288B Meibomian gland dysfunction left eye, upper and lower eyelids: Secondary | ICD-10-CM | POA: Diagnosis not present

## 2023-03-16 ENCOUNTER — Ambulatory Visit (INDEPENDENT_AMBULATORY_CARE_PROVIDER_SITE_OTHER): Payer: Commercial Managed Care - PPO | Admitting: Nurse Practitioner

## 2023-03-16 ENCOUNTER — Encounter: Payer: Self-pay | Admitting: Nurse Practitioner

## 2023-03-16 VITALS — BP 100/60 | HR 61 | Temp 97.3°F | Resp 16 | Ht 64.0 in | Wt 139.2 lb

## 2023-03-16 DIAGNOSIS — M79672 Pain in left foot: Secondary | ICD-10-CM

## 2023-03-16 DIAGNOSIS — Z1211 Encounter for screening for malignant neoplasm of colon: Secondary | ICD-10-CM

## 2023-03-16 DIAGNOSIS — Z0001 Encounter for general adult medical examination with abnormal findings: Secondary | ICD-10-CM

## 2023-03-16 DIAGNOSIS — Z1159 Encounter for screening for other viral diseases: Secondary | ICD-10-CM | POA: Diagnosis not present

## 2023-03-16 DIAGNOSIS — Z13220 Encounter for screening for lipoid disorders: Secondary | ICD-10-CM | POA: Diagnosis not present

## 2023-03-16 DIAGNOSIS — Z136 Encounter for screening for cardiovascular disorders: Secondary | ICD-10-CM | POA: Diagnosis not present

## 2023-03-16 LAB — CBC
HCT: 40 % (ref 36.0–46.0)
Hemoglobin: 13.5 g/dL (ref 12.0–15.0)
MCHC: 33.8 g/dL (ref 30.0–36.0)
MCV: 93.9 fl (ref 78.0–100.0)
Platelets: 361 10*3/uL (ref 150.0–400.0)
RBC: 4.26 Mil/uL (ref 3.87–5.11)
RDW: 14 % (ref 11.5–15.5)
WBC: 4.7 10*3/uL (ref 4.0–10.5)

## 2023-03-16 LAB — LIPID PANEL
Cholesterol: 183 mg/dL (ref 0–200)
HDL: 95.7 mg/dL (ref >39.00)
LDL Cholesterol: 80 mg/dL (ref 0–99)
NonHDL: 87.06
Total CHOL/HDL Ratio: 2
Triglycerides: 34 mg/dL (ref 0.0–149.0)
VLDL: 6.8 mg/dL (ref 0.0–40.0)

## 2023-03-16 LAB — COMPREHENSIVE METABOLIC PANEL
ALT: 11 U/L (ref 0–35)
AST: 12 U/L (ref 0–37)
Albumin: 4.1 g/dL (ref 3.5–5.2)
Alkaline Phosphatase: 32 U/L — ABNORMAL LOW (ref 39–117)
BUN: 11 mg/dL (ref 6–23)
CO2: 25 mEq/L (ref 19–32)
Calcium: 9.3 mg/dL (ref 8.4–10.5)
Chloride: 105 mEq/L (ref 96–112)
Creatinine, Ser: 0.78 mg/dL (ref 0.40–1.20)
GFR: 91.14 mL/min (ref >60.00)
Glucose, Bld: 96 mg/dL (ref 70–99)
Potassium: 4 mEq/L (ref 3.5–5.1)
Sodium: 139 mEq/L (ref 135–145)
Total Bilirubin: 0.8 mg/dL (ref 0.2–1.2)
Total Protein: 7.2 g/dL (ref 6.0–8.3)

## 2023-03-16 NOTE — Assessment & Plan Note (Signed)
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 No swelling, no injury, no erthema.  Entered podiatry referral

## 2023-03-16 NOTE — Patient Instructions (Signed)
Go to lab Continue Heart healthy diet and daily exercise.  Preventive Care 40-46 Years Old, Female Preventive care refers to lifestyle choices and visits with your health care provider that can promote health and wellness. Preventive care visits are also called wellness exams. What can I expect for my preventive care visit? Counseling Your health care provider may ask you questions about your: Medical history, including: Past medical problems. Family medical history. Pregnancy history. Current health, including: Menstrual cycle. Method of birth control. Emotional well-being. Home life and relationship well-being. Sexual activity and sexual health. Lifestyle, including: Alcohol, nicotine or tobacco, and drug use. Access to firearms. Diet, exercise, and sleep habits. Work and work environment. Sunscreen use. Safety issues such as seatbelt and bike helmet use. Physical exam Your health care provider will check your: Height and weight. These may be used to calculate your BMI (body mass index). BMI is a measurement that tells if you are at a healthy weight. Waist circumference. This measures the distance around your waistline. This measurement also tells if you are at a healthy weight and may help predict your risk of certain diseases, such as type 2 diabetes and high blood pressure. Heart rate and blood pressure. Body temperature. Skin for abnormal spots. What immunizations do I need?  Vaccines are usually given at various ages, according to a schedule. Your health care provider will recommend vaccines for you based on your age, medical history, and lifestyle or other factors, such as travel or where you work. What tests do I need? Screening Your health care provider may recommend screening tests for certain conditions. This may include: Lipid and cholesterol levels. Diabetes screening. This is done by checking your blood sugar (glucose) after you have not eaten for a while  (fasting). Pelvic exam and Pap test. Hepatitis B test. Hepatitis C test. HIV (human immunodeficiency virus) test. STI (sexually transmitted infection) testing, if you are at risk. Lung cancer screening. Colorectal cancer screening. Mammogram. Talk with your health care provider about when you should start having regular mammograms. This may depend on whether you have a family history of breast cancer. BRCA-related cancer screening. This may be done if you have a family history of breast, ovarian, tubal, or peritoneal cancers. Bone density scan. This is done to screen for osteoporosis. Talk with your health care provider about your test results, treatment options, and if necessary, the need for more tests. Follow these instructions at home: Eating and drinking  Eat a diet that includes fresh fruits and vegetables, whole grains, lean protein, and low-fat dairy products. Take vitamin and mineral supplements as recommended by your health care provider. Do not drink alcohol if: Your health care provider tells you not to drink. You are pregnant, may be pregnant, or are planning to become pregnant. If you drink alcohol: Limit how much you have to 0-1 drink a day. Know how much alcohol is in your drink. In the [place] one drink equals one 12 oz bottle of beer (355 mL), one 5 oz glass of wine (148 mL), or one 1 oz glass of hard liquor (44 mL). Lifestyle Brush your teeth every morning and night with fluoride toothpaste. Floss one time each day. Exercise for at least 30 minutes 5 or more days each week. Do not use any products that contain nicotine or tobacco. These products include cigarettes, chewing tobacco, and vaping devices, such as e-cigarettes. If you need help quitting, ask your health care provider. Do not use drugs. If you are sexually active, practice   safe sex. Use a condom or other form of protection to prevent STIs. If you do not wish to become pregnant, use a form of birth control. If  you plan to become pregnant, see your health care provider for a prepregnancy visit. Take aspirin only as told by your health care provider. Make sure that you understand how much to take and what form to take. Work with your health care provider to find out whether it is safe and beneficial for you to take aspirin daily. Find healthy ways to manage stress, such as: Meditation, yoga, or listening to music. Journaling. Talking to a trusted person. Spending time with friends and family. Minimize exposure to UV radiation to reduce your risk of skin cancer. Safety Always wear your seat belt while driving or riding in a vehicle. Do not drive: If you have been drinking alcohol. Do not ride with someone who has been drinking. When you are tired or distracted. While texting. If you have been using any mind-altering substances or drugs. Wear a helmet and other protective equipment during sports activities. If you have firearms in your house, make sure you follow all gun safety procedures. Seek help if you have been physically or sexually abused. What's next? Visit your health care provider once a year for an annual wellness visit. Ask your health care provider how often you should have your eyes and teeth checked. Stay up to date on all vaccines. This information is not intended to replace advice given to you by your health care provider. Make sure you discuss any questions you have with your health care provider. Document Revised: 04/10/2021 Document Reviewed: 04/10/2021 Elsevier Patient Education  2023 [employer]vier Inc[employer]  

## 2023-03-16 NOTE — Progress Notes (Signed)
Complete physical exam  Patient: Tammy [name]   DOB: 08-11-1976   47 y.o. Female  MRN: [id number] Visit Date: 03/16/2023  Subjective:    Chief Complaint  Patient presents with   Annual Exam    Fasting- Yes   Tammy [name] is a 47 y.o. female who presents today for a complete physical exam. She reports consuming a general diet.  walking  She generally feels fairly well. She reports sleeping fairly well. She does not have additional problems to discuss today.  Vision:Yes Dental:Yes STD Screen:No  BP Readings from Last 3 Encounters:  03/16/23 100/60  12/10/22 114/76  03/14/22 110/60   Wt Readings from Last 3 Encounters:  03/16/23 139 lb 3.2 oz (63.1 kg)  12/10/22 145 lb (65.8 kg)  03/14/22 153 lb (69.4 kg)   Most recent fall risk assessment:    12/10/2022   10:03 AM  Fall Risk   Falls in the past year? 0  Number falls in past yr: 0  Injury with Fall? 0  Risk for fall due to : No Fall Risks  Follow up Falls evaluation completed   Depression screen:Yes - No Depression  Most recent depression screenings:    03/16/2023   10:25 AM 12/10/2022   10:03 AM  PHQ 2/9 Scores  PHQ - 2 Score 0 0   HPI  Left foot pain Left 5th toe and great toe numbeness,  Ongoing for several months, intermittent, worse with wearing closed toe shoes or compression stocking. No swelling, no injury, no erthema.  Entered podiatry referral  Past Medical History:  Diagnosis Date   History of chicken pox    Past Surgical History:  Procedure Laterality Date   CESAREAN SECTION     TOE SURGERY     WISDOM TOOTH EXTRACTION     Social History   Socioeconomic History   Marital status: Married    Spouse name: Not on file   Number of children: 3   Years of education: 4   Highest education level: Not on file  Occupational History   Not on file  Tobacco Use   Smoking status: Never   Smokeless tobacco: Never  Vaping Use   Vaping Use: Never used  Substance and Sexual Activity    Alcohol use: Never   Drug use: Never   Sexual activity: Yes    Birth control/protection: Pill  Other Topics Concern   Not on file  Social History Narrative   Not on file   Social Determinants of Health   Financial Resource Strain: Not on file  Food Insecurity: Not on file  Transportation Needs: Not on file  Physical Activity: Not on file  Stress: Not on file  Social Connections: Not on file  Intimate Partner Violence: Not on file   Family Status  Relation Name Status   Mother Holley Bouche Alive   Father Alejandro Mulling Deceased   MGM Fati Seidu Deceased   MGF Fati Seidu Deceased   PGM  Deceased   PGF  Deceased   Family History  Problem Relation Age of Onset   Diabetes Mother    Miscarriages / [place] Mother    Mental illness Mother    Glaucoma Mother    Cancer Father 3       lymphoma   Early death Father    Hypertension Maternal Grandmother    Diabetes Maternal Grandfather    No Known Allergies  Patient Care Team: Reyli Schroth, Bonna Gains, NP as PCP - General (Internal  Medicine)   Medications: Outpatient Medications Prior to Visit  Medication Sig   Calcium-Magnesium-Vitamin D (CALCIUM 500 PO) Take by mouth.   norgestimate-ethinyl estradiol (ORTHO-CYCLEN) 0.25-35 MG-MCG tablet TAKE 1 TABLET BY MOUTH DAILY. MAINTAIN UPCOMING APPT FOR ADDITIONAL REFILLS   UNABLE TO FIND Take 4 tablets by mouth daily. Nutrafol   No facility-administered medications prior to visit.    Review of Systems  Constitutional:  Negative for activity change, appetite change and unexpected weight change.  Respiratory: Negative.    Cardiovascular: Negative.   Gastrointestinal: Negative.   Endocrine: Negative for cold intolerance and heat intolerance.  Genitourinary: Negative.   Musculoskeletal:        Left foot pain  Skin: Negative.   Neurological: Negative.   Hematological: Negative.   Psychiatric/Behavioral:  Negative for behavioral problems, decreased concentration, dysphoric  mood, hallucinations, self-injury, sleep disturbance and suicidal ideas. The patient is not nervous/anxious.        Objective:  BP 100/60 (BP Location: Right Arm, Patient Position: Sitting, Cuff Size: Normal)   Pulse 61   Temp (!) 97.3 F (36.3 C) (Temporal)   Resp 16   Ht 5\' 4"  (1.626 m)   Wt 139 lb 3.2 oz (63.1 kg)   LMP 02/04/2023 (Approximate)   SpO2 100%   BMI 23.89 kg/m     Physical Exam Vitals and nursing note reviewed.  Constitutional:      General: She is not in acute distress. HENT:     Right Ear: Tympanic membrane, ear canal and external ear normal.     Left Ear: Tympanic membrane, ear canal and external ear normal.     Nose: Nose normal.  Eyes:     Extraocular Movements: Extraocular movements intact.     Conjunctiva/sclera: Conjunctivae normal.     Pupils: Pupils are equal, round, and reactive to light.  Neck:     Thyroid: No thyroid mass, thyromegaly or thyroid tenderness.  Cardiovascular:     Rate and Rhythm: Normal rate and regular rhythm.     Pulses: Normal pulses.          Dorsalis pedis pulses are 2+ on the right side and 2+ on the left side.       Posterior tibial pulses are 2+ on the right side and 2+ on the left side.     Heart sounds: Normal heart sounds.  Pulmonary:     Effort: Pulmonary effort is normal.     Breath sounds: Normal breath sounds.  Abdominal:     General: Bowel sounds are normal.     Palpations: Abdomen is soft.  Musculoskeletal:        General: Normal range of motion.     Cervical back: Normal range of motion and neck supple.     Right lower leg: No edema.     Left lower leg: No edema.     Right foot: Normal range of motion. Prominent metatarsal heads present. No deformity, bunion, Charcot foot or foot drop.     Left foot: Normal range of motion. Prominent metatarsal heads present. No deformity, bunion, Charcot foot or foot drop.  Feet:     Right foot:     Skin integrity: Skin integrity normal.     Toenail Condition: Right  toenails are normal.     Left foot:     Skin integrity: Skin integrity normal.     Toenail Condition: Left toenails are normal.  Lymphadenopathy:     Cervical: No cervical adenopathy.  Skin:  General: Skin is warm and dry.  Neurological:     Mental Status: She is alert and oriented to person, place, and time.     Cranial Nerves: No cranial nerve deficit.  Psychiatric:        Mood and Affect: Mood normal.        Behavior: Behavior normal.        Thought Content: Thought content normal.      No results found for any visits on 03/16/23.    Assessment & Plan:    Routine Health Maintenance and Physical Exam  Immunization History  Administered Date(s) Administered   Influenza Whole 09/27/2009   Influenza-Unspecified 07/16/2019, 07/30/2021, 08/01/2022   Meningococcal Mcv4o 05/10/2016   PFIZER(Purple Top)SARS-COV-2 Vaccination 01/12/2020, 02/06/2020   Td 10/09/2004   Tdap 03/02/2019   Health Maintenance  Topic Date Due   Hepatitis C Screening  Never done   COLONOSCOPY (Pts 45-22yrs Insurance coverage will need to be confirmed)  Never done   COVID-19 Vaccine (3 - 2023-24 season) 06/27/2022   INFLUENZA VACCINE  05/28/2023   PAP SMEAR-Modifier  03/12/2024   DTaP/Tdap/Td (3 - Td or Tdap) 03/01/2029   HIV Screening  Completed   HPV VACCINES  Aged Out   Discussed health benefits of physical activity, and encouraged her to engage in regular exercise appropriate for her age and condition.  Problem List Items Addressed This Visit       Other   Left foot pain    Left 5th toe and great toe numbeness,  Ongoing for several months, intermittent, worse with wearing closed toe shoes or compression stocking. No swelling, no injury, no erthema.  Entered podiatry referral      Relevant Orders   Ambulatory referral to Podiatry   Other Visit Diagnoses     Encounter for preventative adult health care exam with abnormal findings    -  Primary   Relevant Orders   CBC    Comprehensive metabolic panel   Lipid panel   Colon cancer screening       Relevant Orders   Ambulatory referral to Gastroenterology   Encounter for hepatitis C screening test for low risk patient       Relevant Orders   Hepatitis C antibody   Encounter for lipid screening for cardiovascular disease       Relevant Orders   Lipid panel      Return in about 1 year (around 03/15/2024) for CPE (fasting).     Alysia Penna, NP

## 2023-03-17 LAB — HEPATITIS C ANTIBODY: Hepatitis C Ab: NONREACTIVE

## 2023-03-17 NOTE — Progress Notes (Signed)
Stable Follow instructions as discussed during office visit.

## 2023-03-25 ENCOUNTER — Ambulatory Visit: Payer: Commercial Managed Care - PPO | Admitting: Podiatry

## 2023-04-02 ENCOUNTER — Ambulatory Visit: Payer: Commercial Managed Care - PPO | Admitting: Podiatry

## 2023-04-02 ENCOUNTER — Ambulatory Visit (INDEPENDENT_AMBULATORY_CARE_PROVIDER_SITE_OTHER): Payer: Commercial Managed Care - PPO

## 2023-04-02 ENCOUNTER — Encounter: Payer: Self-pay | Admitting: Podiatry

## 2023-04-02 DIAGNOSIS — M2042 Other hammer toe(s) (acquired), left foot: Secondary | ICD-10-CM

## 2023-04-02 DIAGNOSIS — M79672 Pain in left foot: Secondary | ICD-10-CM

## 2023-04-02 DIAGNOSIS — M79671 Pain in right foot: Secondary | ICD-10-CM

## 2023-04-02 DIAGNOSIS — L600 Ingrowing nail: Secondary | ICD-10-CM

## 2023-04-02 NOTE — Patient Instructions (Signed)

## 2023-04-02 NOTE — Progress Notes (Signed)
Subjective:   Patient ID: Tammy [name], female   DOB: 47 y.o.   MRN: [id number]   HPI Patient presents stating she has a lot of pain in the outside of the fifth toe left foot and she had had surgery on her right fifth toe over 20 years ago.  States it has been very tender and has been going on for at least 3 months and also has some numbness on the right big toe of 1 month duration.  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       Objective:  Physical Exam Vitals and nursing note reviewed.  Constitutional:      Appearance: She is well-developed.  Pulmonary:     Effort: Pulmonary effort is normal.  Musculoskeletal:        General: Normal range of motion.  Skin:    General: Skin is warm.  Neurological:     Mental Status: She is alert.     Neurovascular status found to be intact muscle strength was found to be adequate range of motion adequate with patient found to have significant rotation fifth digit left with distal lateral nail disease or possible keratotic tissue formation with the fifth digit right foot having had previous surgery it is very flexible but took care of the problem.  Good digital perfusion well-oriented.  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  Patient also is noted to have flexible fifth digit right no pathology currently on the left big toe except for feeling of numbness patient is experiencing     Plan:  H&P reviewed condition.  Reviewed x-rays with her at this point organ to try to treat this is a nail condition even though ultimately may require digital rotation and distal lateral exostectomy.  Today I anesthetized the left fifth toe after she read then signed consent form and I  remove the lateral border I exposed the tissue I applied chemical phenol 3 applications followed by alcohol lavage gave instructions for wearing open toed shoes reappoint to recheck and may require other procedures depending on response  X-rays indicate significant rotation digit 5 bilateral with history of proximal phalanx resection digit 5 right

## 2023-04-24 ENCOUNTER — Other Ambulatory Visit: Payer: Self-pay | Admitting: Podiatry

## 2023-04-24 DIAGNOSIS — L600 Ingrowing nail: Secondary | ICD-10-CM

## 2023-04-24 DIAGNOSIS — M79672 Pain in left foot: Secondary | ICD-10-CM

## 2023-04-24 DIAGNOSIS — M2042 Other hammer toe(s) (acquired), left foot: Secondary | ICD-10-CM

## 2023-04-24 DIAGNOSIS — M79671 Pain in right foot: Secondary | ICD-10-CM

## 2023-06-26 ENCOUNTER — Other Ambulatory Visit: Payer: Self-pay | Admitting: Nurse Practitioner

## 2023-06-26 DIAGNOSIS — Z3041 Encounter for surveillance of contraceptive pills: Secondary | ICD-10-CM

## 2023-07-09 ENCOUNTER — Encounter: Payer: Self-pay | Admitting: Nurse Practitioner

## 2023-07-09 DIAGNOSIS — Z3041 Encounter for surveillance of contraceptive pills: Secondary | ICD-10-CM

## 2023-07-09 MED ORDER — NORGESTIMATE-ETH ESTRADIOL 0.25-35 MG-MCG PO TABS
1.0000 | ORAL_TABLET | Freq: Every day | ORAL | 3 refills | Status: DC
  Filled 2023-10-05: qty 84, 84d supply, fill #0
  Filled 2024-01-05: qty 84, 84d supply, fill #1
  Filled 2024-03-26 – 2024-04-11 (×2): qty 84, 84d supply, fill #2

## 2023-08-12 ENCOUNTER — Encounter: Payer: Self-pay | Admitting: Nurse Practitioner

## 2023-08-12 DIAGNOSIS — Z111 Encounter for screening for respiratory tuberculosis: Secondary | ICD-10-CM

## 2023-08-14 NOTE — Addendum Note (Signed)
Addended by: Alysia Penna L on: 08/14/2023 10:01 AM   Modules accepted: Orders

## 2023-08-19 ENCOUNTER — Ambulatory Visit
Admission: RE | Admit: 2023-08-19 | Discharge: 2023-08-19 | Disposition: A | Discharge status: Home / Self Care | Payer: Commercial Managed Care - PPO | Source: Ambulatory Visit | Attending: Nurse Practitioner

## 2023-08-19 DIAGNOSIS — Z111 Encounter for screening for respiratory tuberculosis: Secondary | ICD-10-CM

## 2023-09-08 ENCOUNTER — Encounter: Payer: Self-pay | Admitting: Nurse Practitioner

## 2023-10-02 ENCOUNTER — Other Ambulatory Visit (HOSPITAL_COMMUNITY): Payer: Self-pay

## 2023-10-03 ENCOUNTER — Other Ambulatory Visit (HOSPITAL_COMMUNITY): Payer: Self-pay

## 2023-10-05 ENCOUNTER — Other Ambulatory Visit (HOSPITAL_COMMUNITY): Payer: Self-pay

## 2023-10-07 ENCOUNTER — Other Ambulatory Visit (HOSPITAL_COMMUNITY): Payer: Self-pay

## 2024-01-05 ENCOUNTER — Other Ambulatory Visit (HOSPITAL_COMMUNITY): Payer: Self-pay

## 2024-01-07 ENCOUNTER — Other Ambulatory Visit (HOSPITAL_COMMUNITY): Payer: Self-pay

## 2024-03-09 ENCOUNTER — Encounter (HOSPITAL_BASED_OUTPATIENT_CLINIC_OR_DEPARTMENT_OTHER): Payer: Self-pay

## 2024-03-09 ENCOUNTER — Other Ambulatory Visit: Payer: Self-pay

## 2024-03-09 ENCOUNTER — Emergency Department (HOSPITAL_BASED_OUTPATIENT_CLINIC_OR_DEPARTMENT_OTHER)
Admission: EM | Admit: 2024-03-09 | Discharge: 2024-03-09 | Disposition: A | Discharge status: Home / Self Care | Payer: Self-pay | Attending: Emergency Medicine | Admitting: Emergency Medicine

## 2024-03-09 DIAGNOSIS — Y92410 Unspecified street and highway as the place of occurrence of the external cause: Secondary | ICD-10-CM | POA: Diagnosis not present

## 2024-03-09 DIAGNOSIS — S161XXA Strain of muscle, fascia and tendon at neck level, initial encounter: Secondary | ICD-10-CM | POA: Diagnosis not present

## 2024-03-09 DIAGNOSIS — M542 Cervicalgia: Secondary | ICD-10-CM | POA: Diagnosis present

## 2024-03-09 DIAGNOSIS — S46912A Strain of unspecified muscle, fascia and tendon at shoulder and upper arm level, left arm, initial encounter: Secondary | ICD-10-CM | POA: Insufficient documentation

## 2024-03-09 DIAGNOSIS — T148XXA Other injury of unspecified body region, initial encounter: Secondary | ICD-10-CM

## 2024-03-09 NOTE — ED Notes (Signed)
 Discharge paperwork given and verbally understood.
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
## 2024-03-09 NOTE — Discharge Instructions (Addendum)
 Please use ibuprofen over-the-counter as per package instructions for up to 2 to 3 days.  Please drink plenty of fluids with this.  Gentle movement as discussed.  You can also use hot and cold therapy. Return if you are having any new or worsening symptoms Follow-up with your primary care physician as needed.
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
## 2024-03-14 ENCOUNTER — Ambulatory Visit (INDEPENDENT_AMBULATORY_CARE_PROVIDER_SITE_OTHER): Payer: Commercial Managed Care - PPO | Admitting: Nurse Practitioner

## 2024-03-14 ENCOUNTER — Encounter: Payer: Self-pay | Admitting: Nurse Practitioner

## 2024-03-14 VITALS — BP 118/66 | HR 61 | Temp 97.3°F | Ht 64.0 in | Wt 159.8 lb

## 2024-03-14 DIAGNOSIS — Z0000 Encounter for general adult medical examination without abnormal findings: Secondary | ICD-10-CM | POA: Diagnosis not present

## 2024-03-14 DIAGNOSIS — R5382 Chronic fatigue, unspecified: Secondary | ICD-10-CM

## 2024-03-14 DIAGNOSIS — E559 Vitamin D deficiency, unspecified: Secondary | ICD-10-CM | POA: Diagnosis not present

## 2024-03-14 LAB — COMPREHENSIVE METABOLIC PANEL WITH GFR
ALT: 14 U/L (ref 0–35)
AST: 14 U/L (ref 0–37)
Albumin: 4.1 g/dL (ref 3.5–5.2)
Alkaline Phosphatase: 33 U/L — ABNORMAL LOW (ref 39–117)
BUN: 11 mg/dL (ref 6–23)
CO2: 27 meq/L (ref 19–32)
Calcium: 9.1 mg/dL (ref 8.4–10.5)
Chloride: 104 meq/L (ref 96–112)
Creatinine, Ser: 0.72 mg/dL (ref 0.40–1.20)
GFR: 99.63 mL/min (ref >60.00)
Glucose, Bld: 95 mg/dL (ref 70–99)
Potassium: 3.8 meq/L (ref 3.5–5.1)
Sodium: 137 meq/L (ref 135–145)
Total Bilirubin: 0.6 mg/dL (ref 0.2–1.2)
Total Protein: 7.5 g/dL (ref 6.0–8.3)

## 2024-03-14 NOTE — Assessment & Plan Note (Signed)
 Possible related to poor sleep due to school load.  Check IBC, THYROID  and vit. D to r/o other possible causes Advised about importance of proper sleep hygiene.

## 2024-03-14 NOTE — Progress Notes (Signed)
 Complete physical exam  Patient: Tammy [name]   DOB: [birth year]   48 y.o. Female  MRN: [id number] Visit Date: 03/14/2024  Subjective:     Chief Complaint  Patient presents with   Annual Exam   Tammy [name] is a 48 y.o. female who presents today for a complete physical exam. She reports consuming a general diet. Walking occassionally She generally feels well. She reports sleeping fairly well. She does have additional problems to discuss today.  Vision:Within the last year Dental:No STD Screen:No  BP Readings from Last 3 Encounters:  03/14/24 118/66  03/09/24 111/78  03/16/23 100/60   Wt Readings from Last 3 Encounters:  03/14/24 159 lb 12.8 oz (72.5 kg)  03/09/24 145 lb (65.8 kg)  03/16/23 139 lb 3.2 oz (63.1 kg)    Most recent fall risk assessment:    03/14/2024   10:00 AM  Fall Risk   Falls in the past year? 0  Number falls in past yr: 0  Injury with Fall? 0  Risk for fall due to : No Fall Risks  Follow up Falls evaluation completed     Depression screen:Yes - No Depression Most recent depression screenings:    03/14/2024   10:00 AM 03/16/2023   10:25 AM  PHQ 2/9 Scores  PHQ - 2 Score 1 0  PHQ- 9 Score 3     HPI  Chronic fatigue Possible related to poor sleep due to school load.  Check IBC, THYROID  and vit. D to r/o other possible causes Advised about importance of proper sleep hygiene.   Past Medical History:  Diagnosis Date   History of chicken pox    Past Surgical History:  Procedure Laterality Date   CESAREAN SECTION     TOE SURGERY     WISDOM TOOTH EXTRACTION     Social History   Socioeconomic History   Marital status: Married    Spouse name: Not on file   Number of children: 3   Years of education: 4   Highest education level: Not on file  Occupational History   Not on file  Tobacco Use   Smoking status: Never   Smokeless tobacco: Never  Vaping Use   Vaping status: Never Used  Substance and Sexual Activity   Alcohol  use: Never   Drug use: Never   Sexual activity: Yes    Birth control/protection: Pill  Other Topics Concern   Not on file  Social History Narrative   Not on file   Social Drivers of Health   Financial Resource Strain: Not on file  Food Insecurity: Not on file  Transportation Needs: Not on file  Physical Activity: Not on file  Stress: Not on file  Social Connections: Not on file  Intimate Partner Violence: Not on file   Family Status  Relation Name Status   Mother Carren Civatte Alive   Father Launa Police Deceased   MGM Fati Seidu Deceased   MGF Fati Seidu Deceased   PGM  Deceased   PGF  Deceased  No partnership data on file   Family History  Problem Relation Age of Onset   Diabetes Mother    Miscarriages / [place] Mother    Mental illness Mother    Glaucoma Mother    Cancer Father 62       lymphoma   Early death Father    Hypertension Maternal Grandmother    Diabetes Maternal Grandfather    No Known Allergies  Patient Care Team:  Annelie Boak, Connye Delaine, NP as PCP - General (Internal Medicine)   Medications: Outpatient Medications Prior to Visit  Medication Sig   norgestimate -ethinyl estradiol  (ORTHO-CYCLEN) 0.25-35 MG-MCG tablet Take 1 tablet by mouth daily.   Multiple Vitamin (MULTIVITAMIN ADULT PO) Take 4 capsules by mouth daily.   [DISCONTINUED] Calcium-Magnesium-Vitamin D (CALCIUM 500 PO) Take by mouth. (Patient not taking: Reported on 03/14/2024)   No facility-administered medications prior to visit.    Review of Systems      Objective:  BP 118/66 (BP Location: Right Arm, Patient Position: Sitting, Cuff Size: Large)   Pulse 61   Temp (!) 97.3 F (36.3 C) (Temporal)   Ht 5\' 4"  (1.626 m)   Wt 159 lb 12.8 oz (72.5 kg)   LMP 02/16/2024   SpO2 99%   BMI 27.43 kg/m     Physical Exam Vitals and nursing note reviewed.  HENT:     Right Ear: Tympanic membrane, ear canal and external ear normal.     Left Ear: Tympanic membrane, ear canal and  external ear normal.     Nose: Nose normal.  Eyes:     Extraocular Movements: Extraocular movements intact.     Conjunctiva/sclera: Conjunctivae normal.     Pupils: Pupils are equal, round, and reactive to light.  Cardiovascular:     Rate and Rhythm: Normal rate and regular rhythm.     Pulses: Normal pulses.     Heart sounds: Normal heart sounds.  Pulmonary:     Effort: Pulmonary effort is normal.     Breath sounds: Normal breath sounds.  Abdominal:     General: Bowel sounds are normal.     Palpations: Abdomen is soft.  Musculoskeletal:     Cervical back: Normal range of motion and neck supple.     Right lower leg: No edema.     Left lower leg: No edema.  Lymphadenopathy:     Cervical: No cervical adenopathy.  Skin:    General: Skin is warm and dry.     Findings: No erythema or rash.  Neurological:     Mental Status: She is alert and oriented to person, place, and time.  Psychiatric:        Mood and Affect: Mood normal.        Thought Content: Thought content normal.        Judgment: Judgment normal.     No results found for any visits on 03/14/24.    Assessment & Plan:    Routine Health Maintenance and Physical Exam  Immunization History  Administered Date(s) Administered   Influenza Whole 09/27/2009   Influenza-Unspecified 07/16/2019, 07/30/2021, 08/01/2022, 07/28/2023   Meningococcal Mcv4o 05/10/2016   PFIZER(Purple Top)SARS-COV-2 Vaccination 01/12/2020, 02/06/2020, 08/30/2020   Td 10/09/2004   Tdap 03/02/2019   Health Maintenance  Topic Date Due   Colonoscopy  Never done   COVID-19 Vaccine (4 - 2024-25 season) 03/30/2024 (Originally 06/28/2023)   INFLUENZA VACCINE  05/27/2024   Cervical Cancer Screening (HPV/Pap Cotest)  03/12/2026   DTaP/Tdap/Td (3 - Td or Tdap) 03/01/2029   Hepatitis C Screening  Completed   HIV Screening  Completed   HPV VACCINES  Aged Out   Meningococcal B Vaccine  Aged Out   Discussed health benefits of physical activity, and  encouraged her to engage in regular exercise appropriate for her age and condition. Schedule mammogram and colonoscopy  Problem List Items Addressed This Visit     Chronic fatigue   Possible related to poor sleep due to school  load.  Check IBC, THYROID  and vit. D to r/o other possible causes Advised about importance of proper sleep hygiene.      Relevant Orders   TSH   IBC + Ferritin   Other Visit Diagnoses       Preventative health care    -  Primary   Relevant Orders   Comprehensive metabolic panel with GFR     Vitamin D insufficiency       Relevant Orders   VITAMIN D 25 Hydroxy (Vit-D Deficiency, Fractures)      Return in about 1 year (around 03/14/2025) for CPE (fasting).     Kathrene Parents, NP

## 2024-03-14 NOTE — Patient Instructions (Signed)
 Go to lab Maintain Heart healthy diet and daily exercise. Maintain current medications. Schedule mammogram and colonoscopy

## 2024-03-15 LAB — IBC + FERRITIN
Ferritin: 19 ng/mL (ref 10.0–291.0)
Iron: 105 ug/dL (ref 42–145)
Saturation Ratios: 24.9 % (ref 20.0–50.0)
TIBC: 421.4 ug/dL (ref 250.0–450.0)
Transferrin: 301 mg/dL (ref 212.0–360.0)

## 2024-03-15 LAB — VITAMIN D 25 HYDROXY (VIT D DEFICIENCY, FRACTURES): VITD: 38.92 ng/mL (ref 30.00–100.00)

## 2024-03-15 LAB — TSH: TSH: 0.79 u[IU]/mL (ref 0.35–5.50)

## 2024-03-17 ENCOUNTER — Ambulatory Visit: Payer: Self-pay | Admitting: Nurse Practitioner

## 2024-04-06 ENCOUNTER — Other Ambulatory Visit (HOSPITAL_COMMUNITY): Payer: Self-pay

## 2024-04-11 ENCOUNTER — Other Ambulatory Visit (HOSPITAL_COMMUNITY): Payer: Self-pay

## 2024-04-26 ENCOUNTER — Other Ambulatory Visit: Payer: Self-pay | Admitting: Nurse Practitioner

## 2024-04-26 DIAGNOSIS — N6489 Other specified disorders of breast: Secondary | ICD-10-CM

## 2024-04-27 ENCOUNTER — Encounter: Payer: Self-pay | Admitting: Physician Assistant

## 2024-05-03 ENCOUNTER — Ambulatory Visit
Admission: RE | Admit: 2024-05-03 | Discharge: 2024-05-03 | Disposition: A | Discharge status: Home / Self Care | Source: Ambulatory Visit | Attending: Nurse Practitioner | Admitting: Nurse Practitioner

## 2024-05-03 DIAGNOSIS — N6489 Other specified disorders of breast: Secondary | ICD-10-CM

## 2024-05-12 ENCOUNTER — Encounter

## 2024-06-21 NOTE — Progress Notes (Unsigned)
 Ellouise Console, PA-C [address] [place], [place]  [address] Phone: [phone]   Gastroenterology Consultation  Referring Provider:     Nche, Roselie Rockford, NP Primary Care Physician:  Tammy Roselie Rockford, NP Primary Gastroenterologist:  Ellouise Console, PA-C / Elspeth Naval, MD  Reason for Consultation:     Schedule first screening colonoscopy        HPI:   Tammy [name] is a 48 y.o. y/o female referred for consultation & management  by Tammy Roselie Rockford, NP.    Patient is here to discuss scheduling first screening colonoscopy.  No previous GI evaluation or colonoscopy.  No family history of colon cancer.  She has never had a colonoscopy.    She denies any GI symptoms such as abdominal pain, dysphagia, bowel irregularities, or rectal bleeding.  Rare episode of heartburn which is relieved with dietary modification.  She has no concerns today.  Social history: She is married and works as a Engineer, civil (consulting).  Has 3 children, 1 boy and 2 girls.  Denies tobacco, alcohol, or drug use.    Past Medical History:  Diagnosis Date   History of chicken pox     Past Surgical History:  Procedure Laterality Date   CESAREAN SECTION     TOE SURGERY     WISDOM TOOTH EXTRACTION      Prior to Admission medications   Medication Sig Start Date End Date Taking? Authorizing Provider  Multiple Vitamin (MULTIVITAMIN ADULT PO) Take 4 capsules by mouth daily.    [provider]  norgestimate -ethinyl estradiol  (ORTHO-CYCLEN) 0.25-35 MG-MCG tablet Take 1 tablet by mouth daily. 07/09/23   Nche, Roselie Rockford, NP    Family History  Problem Relation Age of Onset   Diabetes Mother    Miscarriages / [place] Mother    Mental illness Mother    Glaucoma Mother    Cancer Father 77       lymphoma   Early death Father    Hypertension Maternal Grandmother    Diabetes Maternal Grandfather      Social History   Tobacco Use   Smoking status: Never   Smokeless tobacco: Never  Vaping  Use   Vaping status: Never Used  Substance Use Topics   Alcohol use: Never   Drug use: Never    Allergies as of 06/22/2024   (No Known Allergies)    Review of Systems:    All systems reviewed and negative except where noted in HPI.   Physical Exam:  BP 100/70 (BP Location: Left Arm, Patient Position: Sitting, Cuff Size: Normal)   Pulse 76   Ht 5' 4 (1.626 m)   Wt 163 lb 8 oz (74.2 kg)   SpO2 (!) 89%   BMI 28.06 kg/m  No LMP recorded. Patient is perimenopausal.  General:   Alert,  Well-developed, well-nourished, pleasant and cooperative in NAD Lungs:  Respirations even and unlabored.  Clear throughout to auscultation.   No wheezes, crackles, or rhonchi. No acute distress. Heart:  Regular rate and rhythm; no murmurs, clicks, rubs, or gallops. Abdomen:  Normal bowel sounds.  No bruits.  Soft, and non-distended without masses, hepatosplenomegaly or hernias noted.  No Tenderness.  No guarding or rebound tenderness.    Neurologic:  Alert and oriented x3;  grossly normal neurologically. Psych:  Alert and cooperative. Normal mood and affect.  Imaging Studies: No results found.  Labs: CBC    Component Value Date/Time   WBC 4.7 03/16/2023 1102  RBC 4.26 03/16/2023 1102   HGB 13.5 03/16/2023 1102   HCT 40.0 03/16/2023 1102   PLT 361.0 03/16/2023 1102   MCV 93.9 03/16/2023 1102    CMP     Component Value Date/Time   NA 137 03/14/2024 1052   K 3.8 03/14/2024 1052   CL 104 03/14/2024 1052   CO2 27 03/14/2024 1052   GLUCOSE 95 03/14/2024 1052   BUN 11 03/14/2024 1052   CREATININE 0.72 03/14/2024 1052   CALCIUM 9.1 03/14/2024 1052   PROT 7.5 03/14/2024 1052   ALBUMIN 4.1 03/14/2024 1052   AST 14 03/14/2024 1052   ALT 14 03/14/2024 1052   ALKPHOS 33 (L) 03/14/2024 1052   BILITOT 0.6 03/14/2024 1052   GFRNONAA 90 (L) 01/14/2015 2211   GFRAA >90 01/14/2015 2211    Assessment and Plan:   Tammy [name] is a 48 y.o. y/o female has been referred for:  1.  Colon  cancer screening Scheduling Colonoscopy I discussed risks of colonoscopy with patient to include risk of bleeding, colon perforation, and risk of sedation.  Patient expressed understanding and agrees to proceed with colonoscopy.   Follow up as needed based on colonoscopy results.  Ellouise Console, PA-C

## 2024-06-22 ENCOUNTER — Ambulatory Visit: Admitting: Physician Assistant

## 2024-06-22 ENCOUNTER — Encounter: Payer: Self-pay | Admitting: Physician Assistant

## 2024-06-22 VITALS — BP 100/70 | HR 76 | Ht 64.0 in | Wt 163.5 lb

## 2024-06-22 DIAGNOSIS — Z01818 Encounter for other preprocedural examination: Secondary | ICD-10-CM

## 2024-06-22 DIAGNOSIS — Z1211 Encounter for screening for malignant neoplasm of colon: Secondary | ICD-10-CM

## 2024-06-22 MED ORDER — NA SULFATE-K SULFATE-MG SULF 17.5-3.13-1.6 GM/177ML PO SOLN: 1.0000 | Freq: Once | ORAL | 0 refills | Status: AC

## 2024-06-22 NOTE — Patient Instructions (Signed)
 You have been scheduled for an Endoscopy. Please follow written instructions given to you at your visit today.  If you use inhalers (even only as needed), please bring them with you on the day of your procedure.  If you take any of the following medications, they will need to be adjusted prior to your procedure:   DO NOT TAKE 7 DAYS PRIOR TO TEST- Trulicity (dulaglutide) Ozempic, Wegovy (semaglutide) Mounjaro (tirzepatide) Bydureon Bcise (exanatide extended release)  DO NOT TAKE 1 DAY PRIOR TO YOUR TEST Rybelsus (semaglutide) Adlyxin (lixisenatide) Victoza (liraglutide) Byetta (exanatide) ___________________________________________________________________________  Please follow up sooner if symptoms increase or worsen  Due to recent changes in healthcare laws, you may see the results of your imaging and laboratory studies on MyChart before your provider has had a chance to review them.  We understand that in some cases there may be results that are confusing or concerning to you. Not all laboratory results come back in the same time frame and the provider may be waiting for multiple results in order to interpret others.  Please give us  48 hours in order for your provider to thoroughly review all the results before contacting the office for clarification of your results.   Thank you for trusting me with your gastrointestinal care!   Ellouise Console, PA-C _______________________________________________________  If your blood pressure at your visit was 140/90 or greater, please contact your primary care physician to follow up on this.  _______________________________________________________  If you are age 47 or older, your body mass index should be between 23-30. Your Body mass index is 28.06 kg/m. If this is out of the aforementioned range listed, please consider follow up with your Primary Care Provider.  If you are age 54 or younger, your body mass index should be between 19-25. Your  Body mass index is 28.06 kg/m. If this is out of the aformentioned range listed, please consider follow up with your Primary Care Provider.   ________________________________________________________  The [facility] providers would like to encourage you to use MYCHART to communicate with providers for non-urgent requests or questions.  Due to long hold times on the telephone, sending your provider a message by [facility] may be a faster and more efficient way to get a response.  Please allow 48 business hours for a response.  Please remember that this is for non-urgent requests.  _______________________________________________________

## 2024-06-25 NOTE — Progress Notes (Signed)
 Agree with assessment and plan as outlined.

## 2024-07-07 ENCOUNTER — Other Ambulatory Visit: Payer: Self-pay | Admitting: Nurse Practitioner

## 2024-07-07 DIAGNOSIS — Z3041 Encounter for surveillance of contraceptive pills: Secondary | ICD-10-CM

## 2024-07-08 ENCOUNTER — Other Ambulatory Visit (HOSPITAL_COMMUNITY): Payer: Self-pay

## 2024-07-08 ENCOUNTER — Other Ambulatory Visit (HOSPITAL_BASED_OUTPATIENT_CLINIC_OR_DEPARTMENT_OTHER): Payer: Self-pay

## 2024-07-08 MED ORDER — NORGESTIMATE-ETH ESTRADIOL 0.25-35 MG-MCG PO TABS
1.0000 | ORAL_TABLET | Freq: Every day | ORAL | 3 refills | Status: AC
  Filled 2024-07-08: qty 84, 84d supply, fill #0
  Filled 2024-09-28: qty 84, 84d supply, fill #1

## 2024-07-13 ENCOUNTER — Encounter: Admitting: Gastroenterology

## 2024-07-13 ENCOUNTER — Other Ambulatory Visit (HOSPITAL_COMMUNITY): Payer: Self-pay

## 2024-08-01 ENCOUNTER — Encounter: Payer: Self-pay | Admitting: Gastroenterology

## 2024-08-08 ENCOUNTER — Encounter: Payer: Self-pay | Admitting: Gastroenterology

## 2024-08-08 ENCOUNTER — Ambulatory Visit (AMBULATORY_SURGERY_CENTER): Admitting: Gastroenterology

## 2024-08-08 VITALS — BP 104/64 | HR 62 | Temp 98.1°F | Resp 15 | Ht 64.0 in | Wt 163.0 lb

## 2024-08-08 DIAGNOSIS — Z1211 Encounter for screening for malignant neoplasm of colon: Secondary | ICD-10-CM | POA: Diagnosis present

## 2024-08-08 DIAGNOSIS — K562 Volvulus: Secondary | ICD-10-CM | POA: Diagnosis not present

## 2024-08-08 DIAGNOSIS — D123 Benign neoplasm of transverse colon: Secondary | ICD-10-CM

## 2024-08-08 MED ORDER — SODIUM CHLORIDE 0.9 % IV SOLN: 500.0000 mL | Freq: Once | INTRAVENOUS | Status: DC

## 2024-08-08 NOTE — Progress Notes (Signed)
 To PACU via stretcher, sedated, good respiratory effort, VSS.

## 2024-08-08 NOTE — Progress Notes (Signed)
 [facility] History and Physical   Primary Care Physician:  Nche, Roselie Rockford, NP   Reason for Procedure:   Colon cancer screening  Plan:    colonoscopy     HPI: Tammy [name] is a 48 y.o. female  here for colonoscopy screening - first time exam.   Patient denies any bowel symptoms at this time. No family history of colon cancer known. Otherwise feels well without any cardiopulmonary symptoms.   I have discussed risks / benefits of anesthesia and endoscopic procedure with Tammy [name] and they wish to proceed with the exams as outlined today.    Past Medical History:  Diagnosis Date   History of chicken pox     Past Surgical History:  Procedure Laterality Date   CESAREAN SECTION     TOE SURGERY     WISDOM TOOTH EXTRACTION      Prior to Admission medications   Medication Sig Start Date End Date Taking? Authorizing Provider  norgestimate -ethinyl estradiol  (VYLIBRA ) 0.25-35 MG-MCG tablet Take 1 tablet by mouth daily. 07/08/24  Yes Nche, Roselie Rockford, NP  OVER THE COUNTER MEDICATION Nutraful-hair growth supplement   Yes [provider]  Multiple Vitamin (MULTIVITAMIN ADULT PO) Take 4 capsules by mouth daily.    [provider]    Current Outpatient Medications  Medication Sig Dispense Refill   norgestimate -ethinyl estradiol  (VYLIBRA ) 0.25-35 MG-MCG tablet Take 1 tablet by mouth daily. 84 tablet 3   OVER THE COUNTER MEDICATION Nutraful-hair growth supplement     Multiple Vitamin (MULTIVITAMIN ADULT PO) Take 4 capsules by mouth daily.     Current Facility-Administered Medications  Medication Dose Route Frequency Provider Last Rate Last Admin   0.9 %  sodium chloride  infusion  500 mL Intravenous Once Karsen Fellows, Elspeth SQUIBB, MD        Allergies as of 08/08/2024   (No Known Allergies)    Family History  Problem Relation Age of Onset   Diabetes Mother    Miscarriages / [place] Mother    Mental illness Mother    Glaucoma  Mother    Cancer Father 22       lymphoma   Early death Father    Hypertension Maternal Grandmother    Diabetes Maternal Grandfather    Colon cancer Neg Hx    Esophageal cancer Neg Hx    Rectal cancer Neg Hx    Stomach cancer Neg Hx     Social History   Socioeconomic History   Marital status: Married    Spouse name: Not on file   Number of children: 3   Years of education: 4   Highest education level: Not on file  Occupational History   Not on file  Tobacco Use   Smoking status: Never   Smokeless tobacco: Never  Vaping Use   Vaping status: Never Used  Substance and Sexual Activity   Alcohol use: Never   Drug use: Never   Sexual activity: Yes    Birth control/protection: Pill  Other Topics Concern   Not on file  Social History Narrative   Not on file   Social Drivers of Health   Financial Resource Strain: Not on file  Food Insecurity: Not on file  Transportation Needs: Not on file  Physical Activity: Not on file  Stress: Not on file  Social Connections: Not on file  Intimate Partner Violence: Not on file    Review of Systems: All other review of systems negative except as mentioned in the HPI.  Physical Exam: Vital signs BP 115/77   Pulse 86   Temp 98.1 F (36.7 C) (Temporal)   Ht 5' 4 (1.626 m)   Wt 163 lb (73.9 kg)   LMP 07/14/2024   SpO2 98%   BMI 27.98 kg/m   General:   Alert,  Well-developed, pleasant and cooperative in NAD Lungs:  Clear throughout to auscultation.   Heart:  Regular rate and rhythm Abdomen:  Soft, nontender and nondistended.   Neuro/Psych:  Alert and cooperative. Normal mood and affect. A and O x 3  Marcey Naval, MD [facility]

## 2024-08-08 NOTE — Progress Notes (Signed)
 Called to room to assist during endoscopic procedure.  Patient ID and intended procedure confirmed with present staff. Received instructions for my participation in the procedure from the performing physician.

## 2024-08-08 NOTE — Op Note (Signed)
 [facility] Patient Name: Tammy [name] Procedure Date: 08/08/2024 1:26 PM MRN: [id number] Endoscopist: Elspeth P. Leigh , MD, [id number] Age: 48 Referring MD:  Date of Birth: 1976-01-01 Gender: Female Account #: [id number] Procedure:                Colonoscopy Indications:              Screening for colorectal malignant neoplasm, This                            is the patient's first colonoscopy Medicines:                Monitored Anesthesia Care Procedure:                Pre-Anesthesia Assessment:                           - Prior to the procedure, a History and Physical                            was performed, and patient medications and                            allergies were reviewed. The patient's tolerance of                            previous anesthesia was also reviewed. The risks                            and benefits of the procedure and the sedation                            options and risks were discussed with the patient.                            All questions were answered, and informed consent                            was obtained. Prior Anticoagulants: The patient has                            taken no anticoagulant or antiplatelet agents. ASA                            Grade Assessment: I - A normal, healthy patient.                            After reviewing the risks and benefits, the patient                            was deemed in satisfactory condition to undergo the                            procedure.  After obtaining informed consent, the colonoscope                            was passed under direct vision. Throughout the                            procedure, the patient's blood pressure, pulse, and                            oxygen saturations were monitored continuously. The                            [id number]-HQ190L Colonoscope [id number] was introduced                            through the anus and advanced  to the the cecum,                            identified by appendiceal orifice and ileocecal                            valve. The colonoscopy was performed without                            difficulty. The patient tolerated the procedure                            well. The quality of the bowel preparation was                            adequate. The ileocecal valve, appendiceal orifice,                            and rectum were photographed. Scope In: 1:27:37 PM Scope Out: 1:43:53 PM Scope Withdrawal Time: 0 hours 10 minutes 9 seconds  Total Procedure Duration: 0 hours 16 minutes 16 seconds  Findings:                 The perianal and digital rectal examinations were                            normal.                           The colon was tortuous with looping.                           A 3 mm polyp was found in the transverse colon. The                            polyp was sessile. The polyp was removed with a                            cold snare. Resection and retrieval were complete.  The exam was otherwise without abnormality. Complications:            No immediate complications. Estimated blood loss:                            Minimal. Estimated Blood Loss:     Estimated blood loss was minimal. Impression:               - Tortuous colon with looping.                           - One 3 mm polyp in the transverse colon, removed                            with a cold snare. Resected and retrieved.                           - The examination was otherwise normal. Recommendation:           - Patient has a contact number available for                            emergencies. The signs and symptoms of potential                            delayed complications were discussed with the                            patient. Return to normal activities tomorrow.                            Written discharge instructions were provided to the                             patient.                           - Resume previous diet.                           - Continue present medications.                           - Await pathology results. Elspeth P. [name], MD 08/08/2024 1:47:10 PM This report has been signed electronically.

## 2024-08-08 NOTE — Patient Instructions (Signed)
 YOU HAD AN ENDOSCOPIC PROCEDURE TODAY AT THE [facility]:   Refer to the procedure report that was given to you for any specific questions about what was found during the examination.  If the procedure report does not answer your questions, please call your gastroenterologist to clarify.  If you requested that your care partner not be given the details of your procedure findings, then the procedure report has been included in a sealed envelope for you to review at your convenience later.  YOU SHOULD EXPECT: Some feelings of bloating in the abdomen. Passage of more gas than usual.  Walking can help get rid of the air that was put into your GI tract during the procedure and reduce the bloating. If you had a lower endoscopy (such as a colonoscopy or flexible sigmoidoscopy) you may notice spotting of blood in your stool or on the toilet paper. If you underwent a bowel prep for your procedure, you may not have a normal bowel movement for a few days.  Please Note:  You might notice some irritation and congestion in your nose or some drainage.  This is from the oxygen used during your procedure.  There is no need for concern and it should clear up in a day or so.  SYMPTOMS TO REPORT IMMEDIATELY:  Following lower endoscopy (colonoscopy or flexible sigmoidoscopy):  Excessive amounts of blood in the stool  Significant tenderness or worsening of abdominal pains  Swelling of the abdomen that is new, acute  Fever of 100F or higher   Resume previous diet Continue present medications Await pathology results Handout on polyps given  For urgent or emergent issues, a gastroenterologist can be reached at any hour by calling [phone]. Do not use MyChart messaging for urgent concerns.    DIET:  We do recommend a small meal at first, but then you may proceed to your regular diet.  Drink plenty of fluids but you should avoid alcoholic beverages for 24 hours.  ACTIVITY:  You should plan to take  it easy for the rest of today and you should NOT DRIVE or use heavy machinery until tomorrow (because of the sedation medicines used during the test).    FOLLOW UP: Our staff will call the number listed on your records the next business day following your procedure.  We will call around 7:15- 8:00 am to check on you and address any questions or concerns that you may have regarding the information given to you following your procedure. If we do not reach you, we will leave a message.     If any biopsies were taken you will be contacted by phone or by letter within the next 1-3 weeks.  Please call us  at [phone] if you have not heard about the biopsies in 3 weeks.    SIGNATURES/CONFIDENTIALITY: You and/or your care partner have signed paperwork which will be entered into your electronic medical record.  These signatures attest to the fact that that the information above on your After Visit Summary has been reviewed and is understood.  Full responsibility of the confidentiality of this discharge information lies with you and/or your care-partner.

## 2024-08-09 ENCOUNTER — Telehealth: Payer: Self-pay | Admitting: *Deleted

## 2024-08-09 NOTE — Telephone Encounter (Signed)
 Attempted f/u phone call. No answer. Left message.

## 2024-08-11 ENCOUNTER — Ambulatory Visit: Payer: Self-pay | Admitting: Gastroenterology

## 2024-08-11 LAB — SURGICAL PATHOLOGY

## 2024-09-30 ENCOUNTER — Other Ambulatory Visit (HOSPITAL_COMMUNITY): Payer: Self-pay

## 2024-10-05 ENCOUNTER — Other Ambulatory Visit (HOSPITAL_COMMUNITY): Payer: Self-pay

## 2024-10-31 ENCOUNTER — Other Ambulatory Visit: Payer: Self-pay

## 2024-11-15 ENCOUNTER — Ambulatory Visit (INDEPENDENT_AMBULATORY_CARE_PROVIDER_SITE_OTHER): Admitting: Nurse Practitioner

## 2024-11-15 ENCOUNTER — Other Ambulatory Visit (HOSPITAL_COMMUNITY): Payer: Self-pay

## 2024-11-15 ENCOUNTER — Encounter: Payer: Self-pay | Admitting: Nurse Practitioner

## 2024-11-15 ENCOUNTER — Other Ambulatory Visit: Payer: Self-pay

## 2024-11-15 VITALS — BP 106/70 | HR 86 | Temp 98.2°F | Ht 64.0 in | Wt 167.8 lb

## 2024-11-15 DIAGNOSIS — J40 Bronchitis, not specified as acute or chronic: Secondary | ICD-10-CM | POA: Diagnosis not present

## 2024-11-15 MED ORDER — PROMETHAZINE-DM 6.25-15 MG/5ML PO SYRP
5.0000 mL | ORAL_SOLUTION | Freq: Four times a day (QID) | ORAL | 0 refills | Status: AC | PRN
  Filled 2024-11-15: qty 118, 6d supply, fill #0

## 2024-11-15 NOTE — Patient Instructions (Signed)
 It was great to see you!  Keep drinking plenty of fluids and get rest   Start cough syrup every 4 hours as needed, this may make you sleepy   You can take tylenol  and ibuprofen as needed for pain   Let's follow-up if your symptoms worsen or any concerns   Take care,  Tinnie Harada, NP

## 2024-11-15 NOTE — Progress Notes (Signed)
 "  Acute Visit  BP 106/70 (BP Location: Left Arm, Patient Position: Sitting, Cuff Size: Normal)   Pulse 86   Temp 98.2 F (36.8 C) (Oral)   Ht 5' 4 (1.626 m)   Wt 167 lb 12.8 oz (76.1 kg)   LMP 11/03/2024 (Exact Date)   SpO2 98%   BMI 28.80 kg/m    Subjective:    Patient ID: Tammy [name], female    DOB: 1976-07-22, 49 y.o.   MRN: [id number]  CC: Chief Complaint  Patient presents with   Cough    Productive cough and congestion for 1 week, chest hurts from coughing, SOB, Runny nose    HPI: Tammy [name] is a 49 y.o. female presents for symptoms of congestion.   Discussed the use of AI scribe software for clinical note transcription with the patient, who gave verbal consent to proceed.  For the past week she has had nasal congestion and a productive cough that is worst in the mornings, improves during the day, then worsens again the next morning. She describes the cough as painful and harsh. She has no fever but has chest pain with coughing and shortness of breath with exertion such as climbing stairs or working out. She has been taking over-the-counter NyQuil and drinking fluids. She had a negative COVID-19 test and denies sick contacts.     Past Medical History:  Diagnosis Date   History of chicken pox     Past Surgical History:  Procedure Laterality Date   CESAREAN SECTION     TOE SURGERY     WISDOM TOOTH EXTRACTION      Family History  Problem Relation Age of Onset   Diabetes Mother    Miscarriages / Stillbirths Mother    Mental illness Mother    Glaucoma Mother    Cancer Father 56       lymphoma   Early death Father    Hypertension Maternal Grandmother    Diabetes Maternal Grandfather    Colon cancer Neg Hx    Esophageal cancer Neg Hx    Rectal cancer Neg Hx    Stomach cancer Neg Hx      Social History[1]  Medications Ordered Prior to Encounter[2]   Review of Systems See pertinent positives and negatives per HPI.     Objective:    BP  106/70 (BP Location: Left Arm, Patient Position: Sitting, Cuff Size: Normal)   Pulse 86   Temp 98.2 F (36.8 C) (Oral)   Ht 5' 4 (1.626 m)   Wt 167 lb 12.8 oz (76.1 kg)   LMP 11/03/2024 (Exact Date)   SpO2 98%   BMI 28.80 kg/m   Wt Readings from Last 3 Encounters:  11/15/24 167 lb 12.8 oz (76.1 kg)  08/08/24 163 lb (73.9 kg)  06/22/24 163 lb 8 oz (74.2 kg)    BP Readings from Last 3 Encounters:  11/15/24 106/70  08/08/24 104/64  06/22/24 100/70    Physical Exam Vitals and nursing note reviewed.  Constitutional:      General: She is not in acute distress.    Appearance: Normal appearance.  HENT:     Head: Normocephalic.     Right Ear: Tympanic membrane, ear canal and external ear normal.     Left Ear: Tympanic membrane, ear canal and external ear normal.     Mouth/Throat:     Mouth: Mucous membranes are moist.     Pharynx: Posterior oropharyngeal erythema present. No oropharyngeal exudate.  Eyes:  Conjunctiva/sclera: Conjunctivae normal.  Cardiovascular:     Rate and Rhythm: Normal rate and regular rhythm.     Pulses: Normal pulses.     Heart sounds: Normal heart sounds.  Pulmonary:     Effort: Pulmonary effort is normal.     Breath sounds: Normal breath sounds.     Comments: Coughing frequently during visit Musculoskeletal:     Cervical back: Normal range of motion and neck supple. No tenderness.  Lymphadenopathy:     Cervical: No cervical adenopathy.  Skin:    General: Skin is warm.  Neurological:     General: No focal deficit present.     Mental Status: She is alert and oriented to person, place, and time.  Psychiatric:        Mood and Affect: Mood normal.        Behavior: Behavior normal.        Thought Content: Thought content normal.        Judgment: Judgment normal.        Assessment & Plan:   Bronchitis   Acute viral bronchitis presents with a productive cough, congestion, and exertional dyspnea. There is no fever or signs of pneumonia, so  antibiotics are not indicated. The cough may persist for 6-8 weeks. Prescribe promethazine  DM for nighttime cough every four hours as needed and recommend Delsym for daytime cough on the same schedule. Continue using DayQuil as needed. Increase fluid intake, including hot tea. Use ibuprofen and Tylenol  for pain management, avoiding excessive use. Monitor for worsening symptoms, such as increased shortness of breath or fever, and report if these occur.   Follow up plan: Return if symptoms worsen or fail to improve.  Tammy DELENA Harada, NP  I,Tammy [name],acting as a scribe for [employer], NP.,have documented all relevant documentation on the behalf of Tammy Tavella DELENA Harada, NP.  I, Tammy DELENA Harada, NP, have reviewed all documentation for this visit. The documentation on 11/15/2024 for the exam, diagnosis, procedures, and orders are all accurate and complete.     [1]  Social History Tobacco Use   Smoking status: Never   Smokeless tobacco: Never  Vaping Use   Vaping status: Never Used  Substance Use Topics   Alcohol use: Never   Drug use: Never  [2]  Current Outpatient Medications on File Prior to Visit  Medication Sig Dispense Refill   Multiple Vitamin (MULTIVITAMIN ADULT PO) Take 4 capsules by mouth daily.     norgestimate -ethinyl estradiol  (VYLIBRA ) 0.25-35 MG-MCG tablet Take 1 tablet by mouth daily. 84 tablet 3   OVER THE COUNTER MEDICATION Nutraful-hair growth supplement     No current facility-administered medications on file prior to visit.   "

## 2025-03-16 ENCOUNTER — Encounter: Admitting: Nurse Practitioner
